# Patient Record
Sex: Male | Born: 1980 | Race: White | Hispanic: No | Marital: Married | Smoking: Never smoker
Health system: Southern US, Community
[De-identification: ages and names within clinical notes are randomized; demographics above are authoritative.]

## PROBLEM LIST (undated history)

## (undated) DIAGNOSIS — J939 Pneumothorax, unspecified: Secondary | ICD-10-CM

## (undated) DIAGNOSIS — F419 Anxiety disorder, unspecified: Secondary | ICD-10-CM

## (undated) DIAGNOSIS — F32A Depression, unspecified: Secondary | ICD-10-CM

## (undated) DIAGNOSIS — I10 Essential (primary) hypertension: Secondary | ICD-10-CM

## (undated) DIAGNOSIS — F1021 Alcohol dependence, in remission: Secondary | ICD-10-CM

## (undated) DIAGNOSIS — J069 Acute upper respiratory infection, unspecified: Secondary | ICD-10-CM

## (undated) DIAGNOSIS — H5213 Myopia, bilateral: Secondary | ICD-10-CM

## (undated) DIAGNOSIS — K219 Gastro-esophageal reflux disease without esophagitis: Secondary | ICD-10-CM

## (undated) HISTORY — DX: Acute upper respiratory infection, unspecified: J06.9

## (undated) HISTORY — PX: WISDOM TOOTH EXTRACTION: SHX21

## (undated) HISTORY — PX: ESOPHAGOGASTRODUODENOSCOPY ENDOSCOPY: SHX5814

## (undated) HISTORY — DX: Anxiety disorder, unspecified: F41.9

## (undated) HISTORY — PX: TONSILLECTOMY: SUR1361

## (undated) HISTORY — DX: Depression, unspecified: F32.A

---

## 2010-01-30 DIAGNOSIS — J939 Pneumothorax, unspecified: Secondary | ICD-10-CM

## 2010-01-30 HISTORY — DX: Pneumothorax, unspecified: J93.9

## 2010-02-04 ENCOUNTER — Inpatient Hospital Stay (HOSPITAL_COMMUNITY): Admission: AC | Admit: 2010-02-04 | Discharge: 2010-02-07 | Payer: Self-pay | Source: Home / Self Care

## 2010-02-14 LAB — COMPREHENSIVE METABOLIC PANEL
ALT: 163 U/L — ABNORMAL HIGH (ref 0–53)
AST: 108 U/L — ABNORMAL HIGH (ref 0–37)
Albumin: 4.1 g/dL (ref 3.5–5.2)
Alkaline Phosphatase: 43 U/L (ref 39–117)
BUN: 18 mg/dL (ref 6–23)
CO2: 20 meq/L (ref 19–32)
Calcium: 8.3 mg/dL — ABNORMAL LOW (ref 8.4–10.5)
Chloride: 108 meq/L (ref 96–112)
Creatinine, Ser: 1.03 mg/dL (ref 0.4–1.5)
GFR calc Af Amer: >60 mL/min (ref >60)
GFR calc non Af Amer: >60 mL/min (ref >60)
Glucose, Bld: 158 mg/dL — ABNORMAL HIGH (ref 70–99)
Potassium: 3.4 meq/L — ABNORMAL LOW (ref 3.5–5.1)
Sodium: 140 meq/L (ref 135–145)
Total Bilirubin: 0.9 mg/dL (ref 0.3–1.2)
Total Protein: 6.9 g/dL (ref 6.0–8.3)

## 2010-02-14 LAB — POCT I-STAT, CHEM 8
BUN: 19 mg/dL (ref 6–23)
Calcium, Ion: 0.92 mmol/L — ABNORMAL LOW (ref 1.12–1.32)
Chloride: 109 meq/L (ref 96–112)
Creatinine, Ser: 1.2 mg/dL (ref 0.4–1.5)
Glucose, Bld: 153 mg/dL — ABNORMAL HIGH (ref 70–99)
HCT: 44 % (ref 39.0–52.0)
Hemoglobin: 15 g/dL (ref 13.0–17.0)
Potassium: 3.3 meq/L — ABNORMAL LOW (ref 3.5–5.1)
Sodium: 139 meq/L (ref 135–145)
TCO2: 17 mmol/L (ref 0–100)

## 2010-02-14 LAB — CBC
HCT: 39.9 % (ref 39.0–52.0)
HCT: 37.3 % — ABNORMAL LOW (ref 39.0–52.0)
Hemoglobin: 13.3 g/dL (ref 13.0–17.0)
Hemoglobin: 12.3 g/dL — ABNORMAL LOW (ref 13.0–17.0)
MCH: 31 pg (ref 26.0–34.0)
MCH: 30.7 pg (ref 26.0–34.0)
MCHC: 33.3 g/dL (ref 30.0–36.0)
MCHC: 33 g/dL (ref 30.0–36.0)
MCV: 93 fL (ref 78.0–100.0)
MCV: 93 fL (ref 78.0–100.0)
Platelets: 236 10*3/uL (ref 150–400)
Platelets: 205 10*3/uL (ref 150–400)
RBC: 4.29 MIL/uL (ref 4.22–5.81)
RBC: 4.01 MIL/uL — ABNORMAL LOW (ref 4.22–5.81)
RDW: 14 % (ref 11.5–15.5)
RDW: 14.1 % (ref 11.5–15.5)
WBC: 11.5 10*3/uL — ABNORMAL HIGH (ref 4.0–10.5)
WBC: 6.4 10*3/uL (ref 4.0–10.5)

## 2010-02-14 LAB — BASIC METABOLIC PANEL
BUN: 9 mg/dL (ref 6–23)
CO2: 25 mEq/L (ref 19–32)
Calcium: 8.3 mg/dL — ABNORMAL LOW (ref 8.4–10.5)
Chloride: 104 mEq/L (ref 96–112)
Creatinine, Ser: 1 mg/dL (ref 0.4–1.5)
GFR calc Af Amer: >60 mL/min (ref >60)
GFR calc non Af Amer: >60 mL/min (ref >60)
Glucose, Bld: 162 mg/dL — ABNORMAL HIGH (ref 70–99)
Potassium: 4.4 mEq/L (ref 3.5–5.1)
Sodium: 138 mEq/L (ref 135–145)

## 2010-02-14 LAB — PROTIME-INR
INR: 0.94 (ref 0.00–1.49)
Prothrombin Time: 12.8 s (ref 11.6–15.2)

## 2010-02-14 LAB — URINALYSIS, ROUTINE W REFLEX MICROSCOPIC
Bilirubin Urine: NEGATIVE
Hgb urine dipstick: NEGATIVE
Ketones, ur: 15 mg/dL — AB
Nitrite: NEGATIVE
Protein, ur: NEGATIVE mg/dL
Specific Gravity, Urine: 1.031 — ABNORMAL HIGH (ref 1.005–1.030)
Urine Glucose, Fasting: NEGATIVE mg/dL
Urobilinogen, UA: 0.2 mg/dL (ref 0.0–1.0)
pH: 5 (ref 5.0–8.0)

## 2010-02-14 LAB — MRSA PCR SCREENING: MRSA by PCR: NEGATIVE

## 2010-02-14 LAB — LACTIC ACID, PLASMA: Lactic Acid, Venous: 4.8 mmol/L — ABNORMAL HIGH (ref 0.5–2.2)

## 2010-02-14 LAB — TYPE AND SCREEN
ABO/RH(D): O POS
Antibody Screen: NEGATIVE

## 2010-02-14 LAB — ABO/RH: ABO/RH(D): O POS
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
## 2015-07-21 ENCOUNTER — Emergency Department (HOSPITAL_COMMUNITY)
Admission: EM | Admit: 2015-07-21 | Discharge: 2015-07-21 | Disposition: A | Discharge status: Home / Self Care | Payer: BLUE CROSS/BLUE SHIELD | Attending: Emergency Medicine | Admitting: Emergency Medicine

## 2015-07-21 ENCOUNTER — Encounter (HOSPITAL_COMMUNITY): Payer: Self-pay | Admitting: *Deleted

## 2015-07-21 DIAGNOSIS — R112 Nausea with vomiting, unspecified: Secondary | ICD-10-CM | POA: Insufficient documentation

## 2015-07-21 DIAGNOSIS — R197 Diarrhea, unspecified: Secondary | ICD-10-CM

## 2015-07-21 HISTORY — DX: Essential (primary) hypertension: I10

## 2015-07-21 HISTORY — DX: Pneumothorax, unspecified: J93.9

## 2015-07-21 LAB — CBC WITH DIFFERENTIAL/PLATELET
Basophils Absolute: 0 10*3/uL (ref 0.0–0.1)
Basophils Relative: 1 %
Eosinophils Absolute: 0.1 10*3/uL (ref 0.0–0.7)
Eosinophils Relative: 1 %
HCT: 46.7 % (ref 39.0–52.0)
Hemoglobin: 16.1 g/dL (ref 13.0–17.0)
Lymphocytes Relative: 21 %
Lymphs Abs: 1.2 10*3/uL (ref 0.7–4.0)
MCH: 29.2 pg (ref 26.0–34.0)
MCHC: 34.5 g/dL (ref 30.0–36.0)
MCV: 84.8 fL (ref 78.0–100.0)
Monocytes Absolute: 0.6 10*3/uL (ref 0.1–1.0)
Monocytes Relative: 10 %
Neutro Abs: 4 10*3/uL (ref 1.7–7.7)
Neutrophils Relative %: 67 %
Platelets: 325 10*3/uL (ref 150–400)
RBC: 5.51 MIL/uL (ref 4.22–5.81)
RDW: 13.4 % (ref 11.5–15.5)
WBC: 5.9 10*3/uL (ref 4.0–10.5)

## 2015-07-21 LAB — COMPREHENSIVE METABOLIC PANEL
ALT: 232 U/L — ABNORMAL HIGH (ref 17–63)
AST: 174 U/L — ABNORMAL HIGH (ref 15–41)
Albumin: 4.5 g/dL (ref 3.5–5.0)
Alkaline Phosphatase: 46 U/L (ref 38–126)
Anion gap: 17 — ABNORMAL HIGH (ref 5–15)
BUN: 18 mg/dL (ref 6–20)
CO2: 28 mmol/L (ref 22–32)
Calcium: 9.5 mg/dL (ref 8.9–10.3)
Chloride: 90 mmol/L — ABNORMAL LOW (ref 101–111)
Creatinine, Ser: 1.04 mg/dL (ref 0.61–1.24)
GFR calc Af Amer: >60 mL/min (ref >60)
GFR calc non Af Amer: >60 mL/min (ref >60)
Glucose, Bld: 161 mg/dL — ABNORMAL HIGH (ref 65–99)
Potassium: 3.6 mmol/L (ref 3.5–5.1)
Sodium: 135 mmol/L (ref 135–145)
Total Bilirubin: 2.2 mg/dL — ABNORMAL HIGH (ref 0.3–1.2)
Total Protein: 7.8 g/dL (ref 6.5–8.1)

## 2015-07-21 LAB — URINALYSIS, ROUTINE W REFLEX MICROSCOPIC
Glucose, UA: NEGATIVE mg/dL
Hgb urine dipstick: NEGATIVE
Ketones, ur: 15 mg/dL — AB
LEUKOCYTES UA: NEGATIVE
NITRITE: NEGATIVE
PROTEIN: 30 mg/dL — AB
Specific Gravity, Urine: 1.033 — ABNORMAL HIGH (ref 1.005–1.030)
pH: 5.5 (ref 5.0–8.0)

## 2015-07-21 LAB — URINE MICROSCOPIC-ADD ON

## 2015-07-21 MED ORDER — DIPHENOXYLATE-ATROPINE 2.5-0.025 MG PO TABS: 2.0000 | ORAL_TABLET | Freq: Four times a day (QID) | ORAL | Status: DC | PRN

## 2015-07-21 MED ORDER — SODIUM CHLORIDE 0.9 % IV BOLUS (SEPSIS)
1000.0000 mL | Freq: Once | INTRAVENOUS | Status: AC
  Administered 2015-07-21: 1000 mL via INTRAVENOUS

## 2015-07-21 MED ORDER — ONDANSETRON HCL 4 MG/2ML IJ SOLN
4.0000 mg | Freq: Once | INTRAMUSCULAR | Status: AC
  Administered 2015-07-21: 4 mg via INTRAVENOUS
  Filled 2015-07-21: qty 2

## 2015-07-21 MED ORDER — DIPHENOXYLATE-ATROPINE 2.5-0.025 MG PO TABS
2.0000 | ORAL_TABLET | Freq: Once | ORAL | Status: AC
  Administered 2015-07-21: 2 via ORAL
  Filled 2015-07-21: qty 2

## 2015-07-21 MED ORDER — PROMETHAZINE HCL 25 MG PO TABS: 25.0000 mg | ORAL_TABLET | Freq: Four times a day (QID) | ORAL | Status: DC | PRN

## 2015-07-21 NOTE — ED Provider Notes (Signed)
CSN: [id number]     Arrival date & time 07/21/15  0456 History   First MD Initiated Contact with Patient 07/21/15 [phone]     Chief Complaint  Patient presents with  . Nausea  . Emesis     (Consider location/radiation/quality/duration/timing/severity/associated sxs/prior Treatment) HPI Comments: Patient presents for evaluation of 2 day history of nausea, vomiting and diarrhea. Patient is concerned about the possibility of food poisoning because symptoms began after eating pork. He has not been expressing any abdominal pain. He has not noticed any fever.  Patient is a 35 y.o. male presenting with vomiting.  Emesis Associated symptoms: diarrhea     History reviewed. No pertinent past medical history. History reviewed. No pertinent past surgical history. History reviewed. No pertinent family history. Social History  Substance Use Topics  . Smoking status: Never Smoker   . Smokeless tobacco: None  . Alcohol Use: No    Review of Systems  Gastrointestinal: Positive for nausea, vomiting and diarrhea.  All other systems reviewed and are negative.     Allergies  Review of patient's allergies indicates no known allergies.  Home Medications   Prior to Admission medications   Not on File   BP 155/112 mmHg  Pulse 112  Temp(Src) 97.7 F (36.5 C) (Oral)  Resp 17  SpO2 97% Physical Exam  Constitutional: He is oriented to person, place, and time. He appears well-developed and well-nourished. No distress.  HENT:  Head: Normocephalic and atraumatic.  Right Ear: Hearing normal.  Left Ear: Hearing normal.  Nose: Nose normal.  Mouth/Throat: Oropharynx is clear and moist and mucous membranes are normal.  Eyes: Conjunctivae and EOM are normal. Pupils are equal, round, and reactive to light.  Neck: Normal range of motion. Neck supple.  Cardiovascular: Regular rhythm, S1 normal and S2 normal.  Tachycardia present.  Exam reveals no gallop and no friction rub.   No murmur  heard. Pulmonary/Chest: Effort normal and breath sounds normal. No respiratory distress. He exhibits no tenderness.  Abdominal: Soft. Normal appearance and bowel sounds are normal. There is no hepatosplenomegaly. There is no tenderness. There is no rebound, no guarding, no tenderness at McBurney's point and negative Murphy's sign. No hernia.  Musculoskeletal: Normal range of motion.  Neurological: He is alert and oriented to person, place, and time. He has normal strength. No cranial nerve deficit or sensory deficit. Coordination normal. GCS eye subscore is 4. GCS verbal subscore is 5. GCS motor subscore is 6.  Skin: Skin is warm, dry and intact. No rash noted. No cyanosis.  Psychiatric: He has a normal mood and affect. His speech is normal and behavior is normal. Thought content normal.  Nursing note and vitals reviewed.   ED Course  Procedures (including critical care time) Labs Review Labs Reviewed  CBC WITH DIFFERENTIAL/PLATELET  COMPREHENSIVE METABOLIC PANEL    Imaging Review No results found. I have personally reviewed and evaluated these images and lab results as part of my medical decision-making.   EKG Interpretation None      MDM   Final diagnoses:  None  Nausea and vomiting Diarrhea  Patient presents to the ER for evaluation of nausea, vomiting and diarrhea. Patient concerned about the possibility of food poisoning. He is not expressing abdominal pain. Patient has a completely benign abdominal exam. No evidence of tenderness. No concern for acute surgical process. LFTs were mildly elevated. No right upper quadrant pain or tenderness. Reviewing records reveals previous elevation of his LFTs of unclear etiology. Patient hydrated aggressively here  in the ER and feeling better. Will be discharged with symptomatic treatment and follow-up with GI for repeat testing of his LFTs.    Gilda Creasehristopher J Earlie Schank, MD 07/21/15 [phone]

## 2015-07-21 NOTE — ED Notes (Signed)
Pt verbalized understanding of d/c instructions, prescriptions, and follow-up care. No further questions/concerns, VSS, ambulatory w/ steady gait (refused wheelchair) 

## 2015-07-21 NOTE — ED Notes (Signed)
Pt c/o NVD x 2 days, unable to tolerate fluids at home. Denies abd pain, reports NV started after eating pork

## 2015-07-21 NOTE — Discharge Instructions (Signed)

## 2015-08-13 DIAGNOSIS — F419 Anxiety disorder, unspecified: Secondary | ICD-10-CM | POA: Diagnosis not present

## 2015-08-13 DIAGNOSIS — K219 Gastro-esophageal reflux disease without esophagitis: Secondary | ICD-10-CM | POA: Diagnosis not present

## 2015-08-13 DIAGNOSIS — Z0000 Encounter for general adult medical examination without abnormal findings: Secondary | ICD-10-CM | POA: Diagnosis not present

## 2015-08-16 ENCOUNTER — Emergency Department (HOSPITAL_COMMUNITY)
Admission: EM | Admit: 2015-08-16 | Discharge: 2015-08-16 | Disposition: A | Discharge status: Home / Self Care | Payer: BLUE CROSS/BLUE SHIELD | Attending: Emergency Medicine | Admitting: Emergency Medicine

## 2015-08-16 ENCOUNTER — Encounter (HOSPITAL_COMMUNITY): Payer: Self-pay

## 2015-08-16 DIAGNOSIS — Y9389 Activity, other specified: Secondary | ICD-10-CM | POA: Diagnosis not present

## 2015-08-16 DIAGNOSIS — Y92410 Unspecified street and highway as the place of occurrence of the external cause: Secondary | ICD-10-CM | POA: Diagnosis not present

## 2015-08-16 DIAGNOSIS — F10129 Alcohol abuse with intoxication, unspecified: Secondary | ICD-10-CM | POA: Insufficient documentation

## 2015-08-16 DIAGNOSIS — Y999 Unspecified external cause status: Secondary | ICD-10-CM | POA: Diagnosis not present

## 2015-08-16 DIAGNOSIS — I10 Essential (primary) hypertension: Secondary | ICD-10-CM | POA: Insufficient documentation

## 2015-08-16 DIAGNOSIS — F1010 Alcohol abuse, uncomplicated: Secondary | ICD-10-CM

## 2015-08-16 MED ORDER — SODIUM CHLORIDE 0.9 % IV BOLUS (SEPSIS)
1000.0000 mL | Freq: Once | INTRAVENOUS | Status: AC
  Administered 2015-08-16: 1000 mL via INTRAVENOUS
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
## 2015-08-16 NOTE — ED Notes (Signed)
[employer] PRESENT
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
## 2015-08-16 NOTE — ED Notes (Signed)
ED PA at bedside

## 2015-08-16 NOTE — ED Notes (Signed)
Bed: [facility] Expected date:  Expected time:  Means of arrival:  Comments: 

## 2016-03-15 DIAGNOSIS — K219 Gastro-esophageal reflux disease without esophagitis: Secondary | ICD-10-CM | POA: Diagnosis not present

## 2016-03-15 DIAGNOSIS — F419 Anxiety disorder, unspecified: Secondary | ICD-10-CM | POA: Diagnosis not present

## 2016-08-04 DIAGNOSIS — H109 Unspecified conjunctivitis: Secondary | ICD-10-CM | POA: Diagnosis not present

## 2016-10-30 DIAGNOSIS — J309 Allergic rhinitis, unspecified: Secondary | ICD-10-CM | POA: Diagnosis not present

## 2016-10-30 DIAGNOSIS — K219 Gastro-esophageal reflux disease without esophagitis: Secondary | ICD-10-CM | POA: Diagnosis not present

## 2016-10-30 DIAGNOSIS — F418 Other specified anxiety disorders: Secondary | ICD-10-CM | POA: Diagnosis not present

## 2016-11-23 DIAGNOSIS — K219 Gastro-esophageal reflux disease without esophagitis: Secondary | ICD-10-CM | POA: Diagnosis not present

## 2016-11-23 DIAGNOSIS — R12 Heartburn: Secondary | ICD-10-CM | POA: Diagnosis not present

## 2017-01-15 DIAGNOSIS — K3189 Other diseases of stomach and duodenum: Secondary | ICD-10-CM | POA: Diagnosis not present

## 2017-01-15 DIAGNOSIS — K210 Gastro-esophageal reflux disease with esophagitis: Secondary | ICD-10-CM | POA: Diagnosis not present

## 2017-01-15 DIAGNOSIS — K449 Diaphragmatic hernia without obstruction or gangrene: Secondary | ICD-10-CM | POA: Diagnosis not present

## 2017-01-15 DIAGNOSIS — K219 Gastro-esophageal reflux disease without esophagitis: Secondary | ICD-10-CM | POA: Diagnosis not present

## 2017-01-15 DIAGNOSIS — K229 Disease of esophagus, unspecified: Secondary | ICD-10-CM | POA: Diagnosis not present

## 2017-01-15 DIAGNOSIS — R12 Heartburn: Secondary | ICD-10-CM | POA: Diagnosis not present

## 2017-01-22 DIAGNOSIS — K210 Gastro-esophageal reflux disease with esophagitis: Secondary | ICD-10-CM | POA: Diagnosis not present

## 2017-04-30 DIAGNOSIS — K219 Gastro-esophageal reflux disease without esophagitis: Secondary | ICD-10-CM | POA: Diagnosis not present

## 2017-04-30 DIAGNOSIS — F418 Other specified anxiety disorders: Secondary | ICD-10-CM | POA: Diagnosis not present

## 2017-04-30 DIAGNOSIS — J309 Allergic rhinitis, unspecified: Secondary | ICD-10-CM | POA: Diagnosis not present

## 2017-10-30 DIAGNOSIS — R12 Heartburn: Secondary | ICD-10-CM | POA: Diagnosis not present

## 2017-10-30 DIAGNOSIS — F419 Anxiety disorder, unspecified: Secondary | ICD-10-CM | POA: Diagnosis not present

## 2017-10-30 DIAGNOSIS — E781 Pure hyperglyceridemia: Secondary | ICD-10-CM | POA: Diagnosis not present

## 2018-05-01 DIAGNOSIS — Z0000 Encounter for general adult medical examination without abnormal findings: Secondary | ICD-10-CM | POA: Diagnosis not present

## 2018-09-24 DIAGNOSIS — L255 Unspecified contact dermatitis due to plants, except food: Secondary | ICD-10-CM | POA: Diagnosis not present

## 2018-09-24 DIAGNOSIS — R070 Pain in throat: Secondary | ICD-10-CM | POA: Diagnosis not present

## 2018-10-31 DIAGNOSIS — J309 Allergic rhinitis, unspecified: Secondary | ICD-10-CM | POA: Diagnosis not present

## 2018-10-31 DIAGNOSIS — F418 Other specified anxiety disorders: Secondary | ICD-10-CM | POA: Diagnosis not present

## 2019-05-07 DIAGNOSIS — Z0000 Encounter for general adult medical examination without abnormal findings: Secondary | ICD-10-CM | POA: Diagnosis not present

## 2019-05-07 DIAGNOSIS — E039 Hypothyroidism, unspecified: Secondary | ICD-10-CM | POA: Diagnosis not present

## 2019-05-07 DIAGNOSIS — Z13220 Encounter for screening for lipoid disorders: Secondary | ICD-10-CM | POA: Diagnosis not present

## 2019-06-18 DIAGNOSIS — E039 Hypothyroidism, unspecified: Secondary | ICD-10-CM | POA: Diagnosis not present

## 2019-11-06 DIAGNOSIS — F419 Anxiety disorder, unspecified: Secondary | ICD-10-CM | POA: Diagnosis not present

## 2019-11-06 DIAGNOSIS — J339 Nasal polyp, unspecified: Secondary | ICD-10-CM | POA: Diagnosis not present

## 2019-11-06 DIAGNOSIS — K219 Gastro-esophageal reflux disease without esophagitis: Secondary | ICD-10-CM | POA: Diagnosis not present

## 2019-11-06 DIAGNOSIS — R030 Elevated blood-pressure reading, without diagnosis of hypertension: Secondary | ICD-10-CM | POA: Diagnosis not present

## 2020-01-10 DIAGNOSIS — R059 Cough, unspecified: Secondary | ICD-10-CM | POA: Diagnosis not present

## 2020-02-09 DIAGNOSIS — F1020 Alcohol dependence, uncomplicated: Secondary | ICD-10-CM | POA: Diagnosis not present

## 2020-02-09 DIAGNOSIS — G4700 Insomnia, unspecified: Secondary | ICD-10-CM | POA: Diagnosis not present

## 2020-02-09 DIAGNOSIS — F419 Anxiety disorder, unspecified: Secondary | ICD-10-CM | POA: Diagnosis not present

## 2020-02-09 DIAGNOSIS — F1310 Sedative, hypnotic or anxiolytic abuse, uncomplicated: Secondary | ICD-10-CM | POA: Diagnosis not present

## 2020-02-09 DIAGNOSIS — F329 Major depressive disorder, single episode, unspecified: Secondary | ICD-10-CM | POA: Diagnosis not present

## 2020-02-12 DIAGNOSIS — F329 Major depressive disorder, single episode, unspecified: Secondary | ICD-10-CM | POA: Diagnosis not present

## 2020-02-12 DIAGNOSIS — G4700 Insomnia, unspecified: Secondary | ICD-10-CM | POA: Diagnosis not present

## 2020-02-12 DIAGNOSIS — F1310 Sedative, hypnotic or anxiolytic abuse, uncomplicated: Secondary | ICD-10-CM | POA: Diagnosis not present

## 2020-02-12 DIAGNOSIS — F1020 Alcohol dependence, uncomplicated: Secondary | ICD-10-CM | POA: Diagnosis not present

## 2020-02-12 DIAGNOSIS — F419 Anxiety disorder, unspecified: Secondary | ICD-10-CM | POA: Diagnosis not present

## 2020-02-13 DIAGNOSIS — F329 Major depressive disorder, single episode, unspecified: Secondary | ICD-10-CM | POA: Diagnosis not present

## 2020-02-13 DIAGNOSIS — F419 Anxiety disorder, unspecified: Secondary | ICD-10-CM | POA: Diagnosis not present

## 2020-02-13 DIAGNOSIS — F1310 Sedative, hypnotic or anxiolytic abuse, uncomplicated: Secondary | ICD-10-CM | POA: Diagnosis not present

## 2020-02-13 DIAGNOSIS — G4700 Insomnia, unspecified: Secondary | ICD-10-CM | POA: Diagnosis not present

## 2020-02-13 DIAGNOSIS — F1020 Alcohol dependence, uncomplicated: Secondary | ICD-10-CM | POA: Diagnosis not present

## 2020-02-14 DIAGNOSIS — G4700 Insomnia, unspecified: Secondary | ICD-10-CM | POA: Diagnosis not present

## 2020-02-14 DIAGNOSIS — F1020 Alcohol dependence, uncomplicated: Secondary | ICD-10-CM | POA: Diagnosis not present

## 2020-02-14 DIAGNOSIS — F1310 Sedative, hypnotic or anxiolytic abuse, uncomplicated: Secondary | ICD-10-CM | POA: Diagnosis not present

## 2020-02-14 DIAGNOSIS — F419 Anxiety disorder, unspecified: Secondary | ICD-10-CM | POA: Diagnosis not present

## 2020-02-14 DIAGNOSIS — F329 Major depressive disorder, single episode, unspecified: Secondary | ICD-10-CM | POA: Diagnosis not present

## 2020-02-15 DIAGNOSIS — F329 Major depressive disorder, single episode, unspecified: Secondary | ICD-10-CM | POA: Diagnosis not present

## 2020-02-15 DIAGNOSIS — F1020 Alcohol dependence, uncomplicated: Secondary | ICD-10-CM | POA: Diagnosis not present

## 2020-02-15 DIAGNOSIS — G4700 Insomnia, unspecified: Secondary | ICD-10-CM | POA: Diagnosis not present

## 2020-02-15 DIAGNOSIS — F1310 Sedative, hypnotic or anxiolytic abuse, uncomplicated: Secondary | ICD-10-CM | POA: Diagnosis not present

## 2020-02-15 DIAGNOSIS — F419 Anxiety disorder, unspecified: Secondary | ICD-10-CM | POA: Diagnosis not present

## 2020-02-16 DIAGNOSIS — F419 Anxiety disorder, unspecified: Secondary | ICD-10-CM | POA: Diagnosis not present

## 2020-02-16 DIAGNOSIS — F329 Major depressive disorder, single episode, unspecified: Secondary | ICD-10-CM | POA: Diagnosis not present

## 2020-02-16 DIAGNOSIS — F1310 Sedative, hypnotic or anxiolytic abuse, uncomplicated: Secondary | ICD-10-CM | POA: Diagnosis not present

## 2020-02-16 DIAGNOSIS — G4700 Insomnia, unspecified: Secondary | ICD-10-CM | POA: Diagnosis not present

## 2020-02-16 DIAGNOSIS — F1020 Alcohol dependence, uncomplicated: Secondary | ICD-10-CM | POA: Diagnosis not present

## 2020-02-17 DIAGNOSIS — F1310 Sedative, hypnotic or anxiolytic abuse, uncomplicated: Secondary | ICD-10-CM | POA: Diagnosis not present

## 2020-02-17 DIAGNOSIS — F1020 Alcohol dependence, uncomplicated: Secondary | ICD-10-CM | POA: Diagnosis not present

## 2020-02-17 DIAGNOSIS — F419 Anxiety disorder, unspecified: Secondary | ICD-10-CM | POA: Diagnosis not present

## 2020-02-17 DIAGNOSIS — G4700 Insomnia, unspecified: Secondary | ICD-10-CM | POA: Diagnosis not present

## 2020-02-17 DIAGNOSIS — F329 Major depressive disorder, single episode, unspecified: Secondary | ICD-10-CM | POA: Diagnosis not present

## 2020-02-18 DIAGNOSIS — F1310 Sedative, hypnotic or anxiolytic abuse, uncomplicated: Secondary | ICD-10-CM | POA: Diagnosis not present

## 2020-02-18 DIAGNOSIS — F329 Major depressive disorder, single episode, unspecified: Secondary | ICD-10-CM | POA: Diagnosis not present

## 2020-02-18 DIAGNOSIS — F1020 Alcohol dependence, uncomplicated: Secondary | ICD-10-CM | POA: Diagnosis not present

## 2020-02-18 DIAGNOSIS — G4700 Insomnia, unspecified: Secondary | ICD-10-CM | POA: Diagnosis not present

## 2020-02-18 DIAGNOSIS — F419 Anxiety disorder, unspecified: Secondary | ICD-10-CM | POA: Diagnosis not present

## 2020-02-19 DIAGNOSIS — F419 Anxiety disorder, unspecified: Secondary | ICD-10-CM | POA: Diagnosis not present

## 2020-02-19 DIAGNOSIS — F1020 Alcohol dependence, uncomplicated: Secondary | ICD-10-CM | POA: Diagnosis not present

## 2020-02-19 DIAGNOSIS — F329 Major depressive disorder, single episode, unspecified: Secondary | ICD-10-CM | POA: Diagnosis not present

## 2020-02-19 DIAGNOSIS — F1310 Sedative, hypnotic or anxiolytic abuse, uncomplicated: Secondary | ICD-10-CM | POA: Diagnosis not present

## 2020-02-19 DIAGNOSIS — G4700 Insomnia, unspecified: Secondary | ICD-10-CM | POA: Diagnosis not present

## 2020-02-20 DIAGNOSIS — F1310 Sedative, hypnotic or anxiolytic abuse, uncomplicated: Secondary | ICD-10-CM | POA: Diagnosis not present

## 2020-02-20 DIAGNOSIS — G4700 Insomnia, unspecified: Secondary | ICD-10-CM | POA: Diagnosis not present

## 2020-02-20 DIAGNOSIS — F419 Anxiety disorder, unspecified: Secondary | ICD-10-CM | POA: Diagnosis not present

## 2020-02-20 DIAGNOSIS — F1020 Alcohol dependence, uncomplicated: Secondary | ICD-10-CM | POA: Diagnosis not present

## 2020-02-20 DIAGNOSIS — F329 Major depressive disorder, single episode, unspecified: Secondary | ICD-10-CM | POA: Diagnosis not present

## 2020-02-21 DIAGNOSIS — F1310 Sedative, hypnotic or anxiolytic abuse, uncomplicated: Secondary | ICD-10-CM | POA: Diagnosis not present

## 2020-02-21 DIAGNOSIS — F1020 Alcohol dependence, uncomplicated: Secondary | ICD-10-CM | POA: Diagnosis not present

## 2020-02-21 DIAGNOSIS — F419 Anxiety disorder, unspecified: Secondary | ICD-10-CM | POA: Diagnosis not present

## 2020-02-21 DIAGNOSIS — G4700 Insomnia, unspecified: Secondary | ICD-10-CM | POA: Diagnosis not present

## 2020-02-21 DIAGNOSIS — F329 Major depressive disorder, single episode, unspecified: Secondary | ICD-10-CM | POA: Diagnosis not present

## 2020-02-22 DIAGNOSIS — G4700 Insomnia, unspecified: Secondary | ICD-10-CM | POA: Diagnosis not present

## 2020-02-22 DIAGNOSIS — F329 Major depressive disorder, single episode, unspecified: Secondary | ICD-10-CM | POA: Diagnosis not present

## 2020-02-22 DIAGNOSIS — F1020 Alcohol dependence, uncomplicated: Secondary | ICD-10-CM | POA: Diagnosis not present

## 2020-02-22 DIAGNOSIS — F419 Anxiety disorder, unspecified: Secondary | ICD-10-CM | POA: Diagnosis not present

## 2020-02-22 DIAGNOSIS — F1310 Sedative, hypnotic or anxiolytic abuse, uncomplicated: Secondary | ICD-10-CM | POA: Diagnosis not present

## 2020-02-23 DIAGNOSIS — F419 Anxiety disorder, unspecified: Secondary | ICD-10-CM | POA: Diagnosis not present

## 2020-02-23 DIAGNOSIS — F1310 Sedative, hypnotic or anxiolytic abuse, uncomplicated: Secondary | ICD-10-CM | POA: Diagnosis not present

## 2020-02-23 DIAGNOSIS — G4700 Insomnia, unspecified: Secondary | ICD-10-CM | POA: Diagnosis not present

## 2020-02-23 DIAGNOSIS — F1020 Alcohol dependence, uncomplicated: Secondary | ICD-10-CM | POA: Diagnosis not present

## 2020-02-23 DIAGNOSIS — F329 Major depressive disorder, single episode, unspecified: Secondary | ICD-10-CM | POA: Diagnosis not present

## 2020-02-24 DIAGNOSIS — G4700 Insomnia, unspecified: Secondary | ICD-10-CM | POA: Diagnosis not present

## 2020-02-24 DIAGNOSIS — F1310 Sedative, hypnotic or anxiolytic abuse, uncomplicated: Secondary | ICD-10-CM | POA: Diagnosis not present

## 2020-02-24 DIAGNOSIS — F419 Anxiety disorder, unspecified: Secondary | ICD-10-CM | POA: Diagnosis not present

## 2020-02-24 DIAGNOSIS — F329 Major depressive disorder, single episode, unspecified: Secondary | ICD-10-CM | POA: Diagnosis not present

## 2020-02-24 DIAGNOSIS — F1020 Alcohol dependence, uncomplicated: Secondary | ICD-10-CM | POA: Diagnosis not present

## 2020-02-25 DIAGNOSIS — F1020 Alcohol dependence, uncomplicated: Secondary | ICD-10-CM | POA: Diagnosis not present

## 2020-02-25 DIAGNOSIS — F419 Anxiety disorder, unspecified: Secondary | ICD-10-CM | POA: Diagnosis not present

## 2020-02-25 DIAGNOSIS — F1310 Sedative, hypnotic or anxiolytic abuse, uncomplicated: Secondary | ICD-10-CM | POA: Diagnosis not present

## 2020-02-25 DIAGNOSIS — F329 Major depressive disorder, single episode, unspecified: Secondary | ICD-10-CM | POA: Diagnosis not present

## 2020-02-25 DIAGNOSIS — G4700 Insomnia, unspecified: Secondary | ICD-10-CM | POA: Diagnosis not present

## 2020-02-26 DIAGNOSIS — F1310 Sedative, hypnotic or anxiolytic abuse, uncomplicated: Secondary | ICD-10-CM | POA: Diagnosis not present

## 2020-02-26 DIAGNOSIS — F1020 Alcohol dependence, uncomplicated: Secondary | ICD-10-CM | POA: Diagnosis not present

## 2020-02-26 DIAGNOSIS — F419 Anxiety disorder, unspecified: Secondary | ICD-10-CM | POA: Diagnosis not present

## 2020-02-26 DIAGNOSIS — G4700 Insomnia, unspecified: Secondary | ICD-10-CM | POA: Diagnosis not present

## 2020-02-26 DIAGNOSIS — F329 Major depressive disorder, single episode, unspecified: Secondary | ICD-10-CM | POA: Diagnosis not present

## 2020-02-27 DIAGNOSIS — F419 Anxiety disorder, unspecified: Secondary | ICD-10-CM | POA: Diagnosis not present

## 2020-02-27 DIAGNOSIS — F329 Major depressive disorder, single episode, unspecified: Secondary | ICD-10-CM | POA: Diagnosis not present

## 2020-02-27 DIAGNOSIS — F1310 Sedative, hypnotic or anxiolytic abuse, uncomplicated: Secondary | ICD-10-CM | POA: Diagnosis not present

## 2020-02-27 DIAGNOSIS — F1020 Alcohol dependence, uncomplicated: Secondary | ICD-10-CM | POA: Diagnosis not present

## 2020-02-27 DIAGNOSIS — G4700 Insomnia, unspecified: Secondary | ICD-10-CM | POA: Diagnosis not present

## 2020-02-28 DIAGNOSIS — G4700 Insomnia, unspecified: Secondary | ICD-10-CM | POA: Diagnosis not present

## 2020-02-28 DIAGNOSIS — F329 Major depressive disorder, single episode, unspecified: Secondary | ICD-10-CM | POA: Diagnosis not present

## 2020-02-28 DIAGNOSIS — F419 Anxiety disorder, unspecified: Secondary | ICD-10-CM | POA: Diagnosis not present

## 2020-02-28 DIAGNOSIS — F1020 Alcohol dependence, uncomplicated: Secondary | ICD-10-CM | POA: Diagnosis not present

## 2020-02-28 DIAGNOSIS — F1310 Sedative, hypnotic or anxiolytic abuse, uncomplicated: Secondary | ICD-10-CM | POA: Diagnosis not present

## 2020-02-29 DIAGNOSIS — G4700 Insomnia, unspecified: Secondary | ICD-10-CM | POA: Diagnosis not present

## 2020-02-29 DIAGNOSIS — F419 Anxiety disorder, unspecified: Secondary | ICD-10-CM | POA: Diagnosis not present

## 2020-02-29 DIAGNOSIS — F1310 Sedative, hypnotic or anxiolytic abuse, uncomplicated: Secondary | ICD-10-CM | POA: Diagnosis not present

## 2020-02-29 DIAGNOSIS — F1020 Alcohol dependence, uncomplicated: Secondary | ICD-10-CM | POA: Diagnosis not present

## 2020-02-29 DIAGNOSIS — F329 Major depressive disorder, single episode, unspecified: Secondary | ICD-10-CM | POA: Diagnosis not present

## 2020-03-01 DIAGNOSIS — F419 Anxiety disorder, unspecified: Secondary | ICD-10-CM | POA: Diagnosis not present

## 2020-03-01 DIAGNOSIS — G4700 Insomnia, unspecified: Secondary | ICD-10-CM | POA: Diagnosis not present

## 2020-03-01 DIAGNOSIS — F329 Major depressive disorder, single episode, unspecified: Secondary | ICD-10-CM | POA: Diagnosis not present

## 2020-03-01 DIAGNOSIS — F1310 Sedative, hypnotic or anxiolytic abuse, uncomplicated: Secondary | ICD-10-CM | POA: Diagnosis not present

## 2020-03-01 DIAGNOSIS — F1020 Alcohol dependence, uncomplicated: Secondary | ICD-10-CM | POA: Diagnosis not present

## 2020-03-02 DIAGNOSIS — G4700 Insomnia, unspecified: Secondary | ICD-10-CM | POA: Diagnosis not present

## 2020-03-02 DIAGNOSIS — F1310 Sedative, hypnotic or anxiolytic abuse, uncomplicated: Secondary | ICD-10-CM | POA: Diagnosis not present

## 2020-03-02 DIAGNOSIS — F1020 Alcohol dependence, uncomplicated: Secondary | ICD-10-CM | POA: Diagnosis not present

## 2020-03-02 DIAGNOSIS — F419 Anxiety disorder, unspecified: Secondary | ICD-10-CM | POA: Diagnosis not present

## 2020-03-02 DIAGNOSIS — F329 Major depressive disorder, single episode, unspecified: Secondary | ICD-10-CM | POA: Diagnosis not present

## 2020-03-03 DIAGNOSIS — F1020 Alcohol dependence, uncomplicated: Secondary | ICD-10-CM | POA: Diagnosis not present

## 2020-03-03 DIAGNOSIS — F1310 Sedative, hypnotic or anxiolytic abuse, uncomplicated: Secondary | ICD-10-CM | POA: Diagnosis not present

## 2020-03-03 DIAGNOSIS — F329 Major depressive disorder, single episode, unspecified: Secondary | ICD-10-CM | POA: Diagnosis not present

## 2020-03-03 DIAGNOSIS — G4700 Insomnia, unspecified: Secondary | ICD-10-CM | POA: Diagnosis not present

## 2020-03-03 DIAGNOSIS — F419 Anxiety disorder, unspecified: Secondary | ICD-10-CM | POA: Diagnosis not present

## 2020-03-04 DIAGNOSIS — G4700 Insomnia, unspecified: Secondary | ICD-10-CM | POA: Diagnosis not present

## 2020-03-04 DIAGNOSIS — F329 Major depressive disorder, single episode, unspecified: Secondary | ICD-10-CM | POA: Diagnosis not present

## 2020-03-04 DIAGNOSIS — F1020 Alcohol dependence, uncomplicated: Secondary | ICD-10-CM | POA: Diagnosis not present

## 2020-03-04 DIAGNOSIS — F1310 Sedative, hypnotic or anxiolytic abuse, uncomplicated: Secondary | ICD-10-CM | POA: Diagnosis not present

## 2020-03-04 DIAGNOSIS — F419 Anxiety disorder, unspecified: Secondary | ICD-10-CM | POA: Diagnosis not present

## 2020-03-05 DIAGNOSIS — F329 Major depressive disorder, single episode, unspecified: Secondary | ICD-10-CM | POA: Diagnosis not present

## 2020-03-05 DIAGNOSIS — F1020 Alcohol dependence, uncomplicated: Secondary | ICD-10-CM | POA: Diagnosis not present

## 2020-03-05 DIAGNOSIS — F1310 Sedative, hypnotic or anxiolytic abuse, uncomplicated: Secondary | ICD-10-CM | POA: Diagnosis not present

## 2020-03-05 DIAGNOSIS — F419 Anxiety disorder, unspecified: Secondary | ICD-10-CM | POA: Diagnosis not present

## 2020-03-05 DIAGNOSIS — G4700 Insomnia, unspecified: Secondary | ICD-10-CM | POA: Diagnosis not present

## 2020-03-06 DIAGNOSIS — F1310 Sedative, hypnotic or anxiolytic abuse, uncomplicated: Secondary | ICD-10-CM | POA: Diagnosis not present

## 2020-03-06 DIAGNOSIS — F419 Anxiety disorder, unspecified: Secondary | ICD-10-CM | POA: Diagnosis not present

## 2020-03-06 DIAGNOSIS — F329 Major depressive disorder, single episode, unspecified: Secondary | ICD-10-CM | POA: Diagnosis not present

## 2020-03-06 DIAGNOSIS — F1020 Alcohol dependence, uncomplicated: Secondary | ICD-10-CM | POA: Diagnosis not present

## 2020-03-06 DIAGNOSIS — G4700 Insomnia, unspecified: Secondary | ICD-10-CM | POA: Diagnosis not present

## 2020-03-07 DIAGNOSIS — G4700 Insomnia, unspecified: Secondary | ICD-10-CM | POA: Diagnosis not present

## 2020-03-07 DIAGNOSIS — F329 Major depressive disorder, single episode, unspecified: Secondary | ICD-10-CM | POA: Diagnosis not present

## 2020-03-07 DIAGNOSIS — F1020 Alcohol dependence, uncomplicated: Secondary | ICD-10-CM | POA: Diagnosis not present

## 2020-03-07 DIAGNOSIS — F1310 Sedative, hypnotic or anxiolytic abuse, uncomplicated: Secondary | ICD-10-CM | POA: Diagnosis not present

## 2020-03-07 DIAGNOSIS — F419 Anxiety disorder, unspecified: Secondary | ICD-10-CM | POA: Diagnosis not present

## 2020-03-08 DIAGNOSIS — F419 Anxiety disorder, unspecified: Secondary | ICD-10-CM | POA: Diagnosis not present

## 2020-03-08 DIAGNOSIS — F329 Major depressive disorder, single episode, unspecified: Secondary | ICD-10-CM | POA: Diagnosis not present

## 2020-03-08 DIAGNOSIS — G4700 Insomnia, unspecified: Secondary | ICD-10-CM | POA: Diagnosis not present

## 2020-03-08 DIAGNOSIS — R030 Elevated blood-pressure reading, without diagnosis of hypertension: Secondary | ICD-10-CM | POA: Diagnosis not present

## 2020-03-08 DIAGNOSIS — K219 Gastro-esophageal reflux disease without esophagitis: Secondary | ICD-10-CM | POA: Diagnosis not present

## 2020-03-08 DIAGNOSIS — F1020 Alcohol dependence, uncomplicated: Secondary | ICD-10-CM | POA: Diagnosis not present

## 2020-03-08 DIAGNOSIS — R45851 Suicidal ideations: Secondary | ICD-10-CM | POA: Diagnosis not present

## 2020-03-08 DIAGNOSIS — F1310 Sedative, hypnotic or anxiolytic abuse, uncomplicated: Secondary | ICD-10-CM | POA: Diagnosis not present

## 2020-03-08 DIAGNOSIS — R4586 Emotional lability: Secondary | ICD-10-CM | POA: Diagnosis not present

## 2020-03-08 DIAGNOSIS — Z8616 Personal history of COVID-19: Secondary | ICD-10-CM | POA: Diagnosis not present

## 2020-03-09 DIAGNOSIS — R45851 Suicidal ideations: Secondary | ICD-10-CM | POA: Diagnosis not present

## 2020-03-09 DIAGNOSIS — Z8616 Personal history of COVID-19: Secondary | ICD-10-CM | POA: Diagnosis not present

## 2020-03-09 DIAGNOSIS — F1310 Sedative, hypnotic or anxiolytic abuse, uncomplicated: Secondary | ICD-10-CM | POA: Diagnosis not present

## 2020-03-09 DIAGNOSIS — K219 Gastro-esophageal reflux disease without esophagitis: Secondary | ICD-10-CM | POA: Diagnosis not present

## 2020-03-09 DIAGNOSIS — R4586 Emotional lability: Secondary | ICD-10-CM | POA: Diagnosis not present

## 2020-03-09 DIAGNOSIS — F419 Anxiety disorder, unspecified: Secondary | ICD-10-CM | POA: Diagnosis not present

## 2020-03-09 DIAGNOSIS — G4700 Insomnia, unspecified: Secondary | ICD-10-CM | POA: Diagnosis not present

## 2020-03-09 DIAGNOSIS — F1020 Alcohol dependence, uncomplicated: Secondary | ICD-10-CM | POA: Diagnosis not present

## 2020-03-09 DIAGNOSIS — R030 Elevated blood-pressure reading, without diagnosis of hypertension: Secondary | ICD-10-CM | POA: Diagnosis not present

## 2020-03-09 DIAGNOSIS — F329 Major depressive disorder, single episode, unspecified: Secondary | ICD-10-CM | POA: Diagnosis not present

## 2020-03-11 DIAGNOSIS — R4586 Emotional lability: Secondary | ICD-10-CM | POA: Diagnosis not present

## 2020-03-11 DIAGNOSIS — K219 Gastro-esophageal reflux disease without esophagitis: Secondary | ICD-10-CM | POA: Diagnosis not present

## 2020-03-11 DIAGNOSIS — Z8616 Personal history of COVID-19: Secondary | ICD-10-CM | POA: Diagnosis not present

## 2020-03-11 DIAGNOSIS — R45851 Suicidal ideations: Secondary | ICD-10-CM | POA: Diagnosis not present

## 2020-03-11 DIAGNOSIS — R030 Elevated blood-pressure reading, without diagnosis of hypertension: Secondary | ICD-10-CM | POA: Diagnosis not present

## 2020-03-11 DIAGNOSIS — F1020 Alcohol dependence, uncomplicated: Secondary | ICD-10-CM | POA: Diagnosis not present

## 2020-03-11 DIAGNOSIS — F419 Anxiety disorder, unspecified: Secondary | ICD-10-CM | POA: Diagnosis not present

## 2020-03-11 DIAGNOSIS — F1310 Sedative, hypnotic or anxiolytic abuse, uncomplicated: Secondary | ICD-10-CM | POA: Diagnosis not present

## 2020-03-11 DIAGNOSIS — F329 Major depressive disorder, single episode, unspecified: Secondary | ICD-10-CM | POA: Diagnosis not present

## 2020-03-11 DIAGNOSIS — G4700 Insomnia, unspecified: Secondary | ICD-10-CM | POA: Diagnosis not present

## 2020-03-15 DIAGNOSIS — F419 Anxiety disorder, unspecified: Secondary | ICD-10-CM | POA: Diagnosis not present

## 2020-03-15 DIAGNOSIS — F1020 Alcohol dependence, uncomplicated: Secondary | ICD-10-CM | POA: Diagnosis not present

## 2020-03-15 DIAGNOSIS — G4700 Insomnia, unspecified: Secondary | ICD-10-CM | POA: Diagnosis not present

## 2020-03-15 DIAGNOSIS — Z8616 Personal history of COVID-19: Secondary | ICD-10-CM | POA: Diagnosis not present

## 2020-03-15 DIAGNOSIS — R4586 Emotional lability: Secondary | ICD-10-CM | POA: Diagnosis not present

## 2020-03-15 DIAGNOSIS — K219 Gastro-esophageal reflux disease without esophagitis: Secondary | ICD-10-CM | POA: Diagnosis not present

## 2020-03-15 DIAGNOSIS — F329 Major depressive disorder, single episode, unspecified: Secondary | ICD-10-CM | POA: Diagnosis not present

## 2020-03-15 DIAGNOSIS — F1310 Sedative, hypnotic or anxiolytic abuse, uncomplicated: Secondary | ICD-10-CM | POA: Diagnosis not present

## 2020-03-15 DIAGNOSIS — R030 Elevated blood-pressure reading, without diagnosis of hypertension: Secondary | ICD-10-CM | POA: Diagnosis not present

## 2020-03-15 DIAGNOSIS — R45851 Suicidal ideations: Secondary | ICD-10-CM | POA: Diagnosis not present

## 2020-03-16 DIAGNOSIS — F1310 Sedative, hypnotic or anxiolytic abuse, uncomplicated: Secondary | ICD-10-CM | POA: Diagnosis not present

## 2020-03-16 DIAGNOSIS — F1020 Alcohol dependence, uncomplicated: Secondary | ICD-10-CM | POA: Diagnosis not present

## 2020-03-16 DIAGNOSIS — F419 Anxiety disorder, unspecified: Secondary | ICD-10-CM | POA: Diagnosis not present

## 2020-03-16 DIAGNOSIS — R4586 Emotional lability: Secondary | ICD-10-CM | POA: Diagnosis not present

## 2020-03-16 DIAGNOSIS — Z8616 Personal history of COVID-19: Secondary | ICD-10-CM | POA: Diagnosis not present

## 2020-03-16 DIAGNOSIS — R030 Elevated blood-pressure reading, without diagnosis of hypertension: Secondary | ICD-10-CM | POA: Diagnosis not present

## 2020-03-16 DIAGNOSIS — F329 Major depressive disorder, single episode, unspecified: Secondary | ICD-10-CM | POA: Diagnosis not present

## 2020-03-16 DIAGNOSIS — G4700 Insomnia, unspecified: Secondary | ICD-10-CM | POA: Diagnosis not present

## 2020-03-16 DIAGNOSIS — K219 Gastro-esophageal reflux disease without esophagitis: Secondary | ICD-10-CM | POA: Diagnosis not present

## 2020-03-16 DIAGNOSIS — R45851 Suicidal ideations: Secondary | ICD-10-CM | POA: Diagnosis not present

## 2020-03-18 DIAGNOSIS — R4586 Emotional lability: Secondary | ICD-10-CM | POA: Diagnosis not present

## 2020-03-18 DIAGNOSIS — Z8616 Personal history of COVID-19: Secondary | ICD-10-CM | POA: Diagnosis not present

## 2020-03-18 DIAGNOSIS — F419 Anxiety disorder, unspecified: Secondary | ICD-10-CM | POA: Diagnosis not present

## 2020-03-18 DIAGNOSIS — F1310 Sedative, hypnotic or anxiolytic abuse, uncomplicated: Secondary | ICD-10-CM | POA: Diagnosis not present

## 2020-03-18 DIAGNOSIS — R45851 Suicidal ideations: Secondary | ICD-10-CM | POA: Diagnosis not present

## 2020-03-18 DIAGNOSIS — R030 Elevated blood-pressure reading, without diagnosis of hypertension: Secondary | ICD-10-CM | POA: Diagnosis not present

## 2020-03-18 DIAGNOSIS — K219 Gastro-esophageal reflux disease without esophagitis: Secondary | ICD-10-CM | POA: Diagnosis not present

## 2020-03-18 DIAGNOSIS — F1020 Alcohol dependence, uncomplicated: Secondary | ICD-10-CM | POA: Diagnosis not present

## 2020-03-18 DIAGNOSIS — G4700 Insomnia, unspecified: Secondary | ICD-10-CM | POA: Diagnosis not present

## 2020-03-18 DIAGNOSIS — F329 Major depressive disorder, single episode, unspecified: Secondary | ICD-10-CM | POA: Diagnosis not present

## 2020-03-22 DIAGNOSIS — G4700 Insomnia, unspecified: Secondary | ICD-10-CM | POA: Diagnosis not present

## 2020-03-22 DIAGNOSIS — F419 Anxiety disorder, unspecified: Secondary | ICD-10-CM | POA: Diagnosis not present

## 2020-03-22 DIAGNOSIS — R4586 Emotional lability: Secondary | ICD-10-CM | POA: Diagnosis not present

## 2020-03-22 DIAGNOSIS — R030 Elevated blood-pressure reading, without diagnosis of hypertension: Secondary | ICD-10-CM | POA: Diagnosis not present

## 2020-03-22 DIAGNOSIS — Z8616 Personal history of COVID-19: Secondary | ICD-10-CM | POA: Diagnosis not present

## 2020-03-22 DIAGNOSIS — R45851 Suicidal ideations: Secondary | ICD-10-CM | POA: Diagnosis not present

## 2020-03-22 DIAGNOSIS — F1020 Alcohol dependence, uncomplicated: Secondary | ICD-10-CM | POA: Diagnosis not present

## 2020-03-22 DIAGNOSIS — F1310 Sedative, hypnotic or anxiolytic abuse, uncomplicated: Secondary | ICD-10-CM | POA: Diagnosis not present

## 2020-03-22 DIAGNOSIS — K219 Gastro-esophageal reflux disease without esophagitis: Secondary | ICD-10-CM | POA: Diagnosis not present

## 2020-03-22 DIAGNOSIS — F329 Major depressive disorder, single episode, unspecified: Secondary | ICD-10-CM | POA: Diagnosis not present

## 2020-03-23 DIAGNOSIS — R030 Elevated blood-pressure reading, without diagnosis of hypertension: Secondary | ICD-10-CM | POA: Diagnosis not present

## 2020-03-23 DIAGNOSIS — F329 Major depressive disorder, single episode, unspecified: Secondary | ICD-10-CM | POA: Diagnosis not present

## 2020-03-23 DIAGNOSIS — F1020 Alcohol dependence, uncomplicated: Secondary | ICD-10-CM | POA: Diagnosis not present

## 2020-03-23 DIAGNOSIS — F419 Anxiety disorder, unspecified: Secondary | ICD-10-CM | POA: Diagnosis not present

## 2020-03-23 DIAGNOSIS — G4700 Insomnia, unspecified: Secondary | ICD-10-CM | POA: Diagnosis not present

## 2020-03-23 DIAGNOSIS — R45851 Suicidal ideations: Secondary | ICD-10-CM | POA: Diagnosis not present

## 2020-03-23 DIAGNOSIS — Z8616 Personal history of COVID-19: Secondary | ICD-10-CM | POA: Diagnosis not present

## 2020-03-23 DIAGNOSIS — F1310 Sedative, hypnotic or anxiolytic abuse, uncomplicated: Secondary | ICD-10-CM | POA: Diagnosis not present

## 2020-03-23 DIAGNOSIS — K219 Gastro-esophageal reflux disease without esophagitis: Secondary | ICD-10-CM | POA: Diagnosis not present

## 2020-03-23 DIAGNOSIS — R4586 Emotional lability: Secondary | ICD-10-CM | POA: Diagnosis not present

## 2020-03-25 DIAGNOSIS — R4586 Emotional lability: Secondary | ICD-10-CM | POA: Diagnosis not present

## 2020-03-25 DIAGNOSIS — F419 Anxiety disorder, unspecified: Secondary | ICD-10-CM | POA: Diagnosis not present

## 2020-03-25 DIAGNOSIS — K219 Gastro-esophageal reflux disease without esophagitis: Secondary | ICD-10-CM | POA: Diagnosis not present

## 2020-03-25 DIAGNOSIS — R030 Elevated blood-pressure reading, without diagnosis of hypertension: Secondary | ICD-10-CM | POA: Diagnosis not present

## 2020-03-25 DIAGNOSIS — F329 Major depressive disorder, single episode, unspecified: Secondary | ICD-10-CM | POA: Diagnosis not present

## 2020-03-25 DIAGNOSIS — F1020 Alcohol dependence, uncomplicated: Secondary | ICD-10-CM | POA: Diagnosis not present

## 2020-03-25 DIAGNOSIS — F1310 Sedative, hypnotic or anxiolytic abuse, uncomplicated: Secondary | ICD-10-CM | POA: Diagnosis not present

## 2020-03-25 DIAGNOSIS — Z8616 Personal history of COVID-19: Secondary | ICD-10-CM | POA: Diagnosis not present

## 2020-03-25 DIAGNOSIS — G4700 Insomnia, unspecified: Secondary | ICD-10-CM | POA: Diagnosis not present

## 2020-03-25 DIAGNOSIS — R45851 Suicidal ideations: Secondary | ICD-10-CM | POA: Diagnosis not present

## 2020-03-29 DIAGNOSIS — F1020 Alcohol dependence, uncomplicated: Secondary | ICD-10-CM | POA: Diagnosis not present

## 2020-03-29 DIAGNOSIS — K219 Gastro-esophageal reflux disease without esophagitis: Secondary | ICD-10-CM | POA: Diagnosis not present

## 2020-03-29 DIAGNOSIS — F329 Major depressive disorder, single episode, unspecified: Secondary | ICD-10-CM | POA: Diagnosis not present

## 2020-03-29 DIAGNOSIS — G4700 Insomnia, unspecified: Secondary | ICD-10-CM | POA: Diagnosis not present

## 2020-03-29 DIAGNOSIS — F419 Anxiety disorder, unspecified: Secondary | ICD-10-CM | POA: Diagnosis not present

## 2020-03-29 DIAGNOSIS — Z8616 Personal history of COVID-19: Secondary | ICD-10-CM | POA: Diagnosis not present

## 2020-03-29 DIAGNOSIS — R45851 Suicidal ideations: Secondary | ICD-10-CM | POA: Diagnosis not present

## 2020-03-29 DIAGNOSIS — R030 Elevated blood-pressure reading, without diagnosis of hypertension: Secondary | ICD-10-CM | POA: Diagnosis not present

## 2020-03-29 DIAGNOSIS — F1310 Sedative, hypnotic or anxiolytic abuse, uncomplicated: Secondary | ICD-10-CM | POA: Diagnosis not present

## 2020-03-29 DIAGNOSIS — R4586 Emotional lability: Secondary | ICD-10-CM | POA: Diagnosis not present

## 2020-03-30 DIAGNOSIS — F1020 Alcohol dependence, uncomplicated: Secondary | ICD-10-CM | POA: Diagnosis not present

## 2020-03-30 DIAGNOSIS — R45851 Suicidal ideations: Secondary | ICD-10-CM | POA: Diagnosis not present

## 2020-03-30 DIAGNOSIS — Z8616 Personal history of COVID-19: Secondary | ICD-10-CM | POA: Diagnosis not present

## 2020-03-30 DIAGNOSIS — K219 Gastro-esophageal reflux disease without esophagitis: Secondary | ICD-10-CM | POA: Diagnosis not present

## 2020-03-30 DIAGNOSIS — R030 Elevated blood-pressure reading, without diagnosis of hypertension: Secondary | ICD-10-CM | POA: Diagnosis not present

## 2020-03-30 DIAGNOSIS — G4700 Insomnia, unspecified: Secondary | ICD-10-CM | POA: Diagnosis not present

## 2020-03-30 DIAGNOSIS — F329 Major depressive disorder, single episode, unspecified: Secondary | ICD-10-CM | POA: Diagnosis not present

## 2020-03-30 DIAGNOSIS — F419 Anxiety disorder, unspecified: Secondary | ICD-10-CM | POA: Diagnosis not present

## 2020-03-30 DIAGNOSIS — F1310 Sedative, hypnotic or anxiolytic abuse, uncomplicated: Secondary | ICD-10-CM | POA: Diagnosis not present

## 2020-03-30 DIAGNOSIS — R4586 Emotional lability: Secondary | ICD-10-CM | POA: Diagnosis not present

## 2020-04-01 DIAGNOSIS — F419 Anxiety disorder, unspecified: Secondary | ICD-10-CM | POA: Diagnosis not present

## 2020-04-01 DIAGNOSIS — F1020 Alcohol dependence, uncomplicated: Secondary | ICD-10-CM | POA: Diagnosis not present

## 2020-04-01 DIAGNOSIS — F329 Major depressive disorder, single episode, unspecified: Secondary | ICD-10-CM | POA: Diagnosis not present

## 2020-04-01 DIAGNOSIS — K219 Gastro-esophageal reflux disease without esophagitis: Secondary | ICD-10-CM | POA: Diagnosis not present

## 2020-04-01 DIAGNOSIS — R030 Elevated blood-pressure reading, without diagnosis of hypertension: Secondary | ICD-10-CM | POA: Diagnosis not present

## 2020-04-01 DIAGNOSIS — Z8616 Personal history of COVID-19: Secondary | ICD-10-CM | POA: Diagnosis not present

## 2020-04-01 DIAGNOSIS — G4700 Insomnia, unspecified: Secondary | ICD-10-CM | POA: Diagnosis not present

## 2020-04-01 DIAGNOSIS — R4586 Emotional lability: Secondary | ICD-10-CM | POA: Diagnosis not present

## 2020-04-01 DIAGNOSIS — R45851 Suicidal ideations: Secondary | ICD-10-CM | POA: Diagnosis not present

## 2020-04-01 DIAGNOSIS — F1310 Sedative, hypnotic or anxiolytic abuse, uncomplicated: Secondary | ICD-10-CM | POA: Diagnosis not present

## 2020-04-05 DIAGNOSIS — F329 Major depressive disorder, single episode, unspecified: Secondary | ICD-10-CM | POA: Diagnosis not present

## 2020-04-05 DIAGNOSIS — F419 Anxiety disorder, unspecified: Secondary | ICD-10-CM | POA: Diagnosis not present

## 2020-04-05 DIAGNOSIS — R45851 Suicidal ideations: Secondary | ICD-10-CM | POA: Diagnosis not present

## 2020-04-05 DIAGNOSIS — F1020 Alcohol dependence, uncomplicated: Secondary | ICD-10-CM | POA: Diagnosis not present

## 2020-04-05 DIAGNOSIS — Z8616 Personal history of COVID-19: Secondary | ICD-10-CM | POA: Diagnosis not present

## 2020-04-05 DIAGNOSIS — F1310 Sedative, hypnotic or anxiolytic abuse, uncomplicated: Secondary | ICD-10-CM | POA: Diagnosis not present

## 2020-04-05 DIAGNOSIS — G4700 Insomnia, unspecified: Secondary | ICD-10-CM | POA: Diagnosis not present

## 2020-04-05 DIAGNOSIS — R030 Elevated blood-pressure reading, without diagnosis of hypertension: Secondary | ICD-10-CM | POA: Diagnosis not present

## 2020-04-05 DIAGNOSIS — R4586 Emotional lability: Secondary | ICD-10-CM | POA: Diagnosis not present

## 2020-04-05 DIAGNOSIS — K219 Gastro-esophageal reflux disease without esophagitis: Secondary | ICD-10-CM | POA: Diagnosis not present

## 2020-04-06 DIAGNOSIS — F1310 Sedative, hypnotic or anxiolytic abuse, uncomplicated: Secondary | ICD-10-CM | POA: Diagnosis not present

## 2020-04-06 DIAGNOSIS — F1020 Alcohol dependence, uncomplicated: Secondary | ICD-10-CM | POA: Diagnosis not present

## 2020-04-06 DIAGNOSIS — R4586 Emotional lability: Secondary | ICD-10-CM | POA: Diagnosis not present

## 2020-04-06 DIAGNOSIS — Z8616 Personal history of COVID-19: Secondary | ICD-10-CM | POA: Diagnosis not present

## 2020-04-06 DIAGNOSIS — R030 Elevated blood-pressure reading, without diagnosis of hypertension: Secondary | ICD-10-CM | POA: Diagnosis not present

## 2020-04-06 DIAGNOSIS — G4700 Insomnia, unspecified: Secondary | ICD-10-CM | POA: Diagnosis not present

## 2020-04-06 DIAGNOSIS — F329 Major depressive disorder, single episode, unspecified: Secondary | ICD-10-CM | POA: Diagnosis not present

## 2020-04-06 DIAGNOSIS — K219 Gastro-esophageal reflux disease without esophagitis: Secondary | ICD-10-CM | POA: Diagnosis not present

## 2020-04-06 DIAGNOSIS — R45851 Suicidal ideations: Secondary | ICD-10-CM | POA: Diagnosis not present

## 2020-04-06 DIAGNOSIS — F419 Anxiety disorder, unspecified: Secondary | ICD-10-CM | POA: Diagnosis not present

## 2020-04-08 DIAGNOSIS — F1310 Sedative, hypnotic or anxiolytic abuse, uncomplicated: Secondary | ICD-10-CM | POA: Diagnosis not present

## 2020-04-08 DIAGNOSIS — F329 Major depressive disorder, single episode, unspecified: Secondary | ICD-10-CM | POA: Diagnosis not present

## 2020-04-08 DIAGNOSIS — Z8616 Personal history of COVID-19: Secondary | ICD-10-CM | POA: Diagnosis not present

## 2020-04-08 DIAGNOSIS — R4586 Emotional lability: Secondary | ICD-10-CM | POA: Diagnosis not present

## 2020-04-08 DIAGNOSIS — R030 Elevated blood-pressure reading, without diagnosis of hypertension: Secondary | ICD-10-CM | POA: Diagnosis not present

## 2020-04-08 DIAGNOSIS — F1020 Alcohol dependence, uncomplicated: Secondary | ICD-10-CM | POA: Diagnosis not present

## 2020-04-08 DIAGNOSIS — K219 Gastro-esophageal reflux disease without esophagitis: Secondary | ICD-10-CM | POA: Diagnosis not present

## 2020-04-08 DIAGNOSIS — R45851 Suicidal ideations: Secondary | ICD-10-CM | POA: Diagnosis not present

## 2020-04-08 DIAGNOSIS — G4700 Insomnia, unspecified: Secondary | ICD-10-CM | POA: Diagnosis not present

## 2020-04-08 DIAGNOSIS — F419 Anxiety disorder, unspecified: Secondary | ICD-10-CM | POA: Diagnosis not present

## 2020-04-12 DIAGNOSIS — R4586 Emotional lability: Secondary | ICD-10-CM | POA: Diagnosis not present

## 2020-04-12 DIAGNOSIS — F1310 Sedative, hypnotic or anxiolytic abuse, uncomplicated: Secondary | ICD-10-CM | POA: Diagnosis not present

## 2020-04-12 DIAGNOSIS — F329 Major depressive disorder, single episode, unspecified: Secondary | ICD-10-CM | POA: Diagnosis not present

## 2020-04-12 DIAGNOSIS — R030 Elevated blood-pressure reading, without diagnosis of hypertension: Secondary | ICD-10-CM | POA: Diagnosis not present

## 2020-04-12 DIAGNOSIS — R45851 Suicidal ideations: Secondary | ICD-10-CM | POA: Diagnosis not present

## 2020-04-12 DIAGNOSIS — G4700 Insomnia, unspecified: Secondary | ICD-10-CM | POA: Diagnosis not present

## 2020-04-12 DIAGNOSIS — F419 Anxiety disorder, unspecified: Secondary | ICD-10-CM | POA: Diagnosis not present

## 2020-04-12 DIAGNOSIS — F1020 Alcohol dependence, uncomplicated: Secondary | ICD-10-CM | POA: Diagnosis not present

## 2020-04-12 DIAGNOSIS — Z8616 Personal history of COVID-19: Secondary | ICD-10-CM | POA: Diagnosis not present

## 2020-04-12 DIAGNOSIS — K219 Gastro-esophageal reflux disease without esophagitis: Secondary | ICD-10-CM | POA: Diagnosis not present

## 2020-04-13 DIAGNOSIS — Z8616 Personal history of COVID-19: Secondary | ICD-10-CM | POA: Diagnosis not present

## 2020-04-13 DIAGNOSIS — K219 Gastro-esophageal reflux disease without esophagitis: Secondary | ICD-10-CM | POA: Diagnosis not present

## 2020-04-13 DIAGNOSIS — R4586 Emotional lability: Secondary | ICD-10-CM | POA: Diagnosis not present

## 2020-04-13 DIAGNOSIS — R030 Elevated blood-pressure reading, without diagnosis of hypertension: Secondary | ICD-10-CM | POA: Diagnosis not present

## 2020-04-13 DIAGNOSIS — R45851 Suicidal ideations: Secondary | ICD-10-CM | POA: Diagnosis not present

## 2020-04-13 DIAGNOSIS — G4700 Insomnia, unspecified: Secondary | ICD-10-CM | POA: Diagnosis not present

## 2020-04-13 DIAGNOSIS — F419 Anxiety disorder, unspecified: Secondary | ICD-10-CM | POA: Diagnosis not present

## 2020-04-13 DIAGNOSIS — F329 Major depressive disorder, single episode, unspecified: Secondary | ICD-10-CM | POA: Diagnosis not present

## 2020-04-13 DIAGNOSIS — F1020 Alcohol dependence, uncomplicated: Secondary | ICD-10-CM | POA: Diagnosis not present

## 2020-04-13 DIAGNOSIS — F1310 Sedative, hypnotic or anxiolytic abuse, uncomplicated: Secondary | ICD-10-CM | POA: Diagnosis not present

## 2020-04-15 DIAGNOSIS — Z8616 Personal history of COVID-19: Secondary | ICD-10-CM | POA: Diagnosis not present

## 2020-04-15 DIAGNOSIS — K219 Gastro-esophageal reflux disease without esophagitis: Secondary | ICD-10-CM | POA: Diagnosis not present

## 2020-04-15 DIAGNOSIS — R030 Elevated blood-pressure reading, without diagnosis of hypertension: Secondary | ICD-10-CM | POA: Diagnosis not present

## 2020-04-15 DIAGNOSIS — R45851 Suicidal ideations: Secondary | ICD-10-CM | POA: Diagnosis not present

## 2020-04-15 DIAGNOSIS — R4586 Emotional lability: Secondary | ICD-10-CM | POA: Diagnosis not present

## 2020-04-15 DIAGNOSIS — F419 Anxiety disorder, unspecified: Secondary | ICD-10-CM | POA: Diagnosis not present

## 2020-04-15 DIAGNOSIS — F1310 Sedative, hypnotic or anxiolytic abuse, uncomplicated: Secondary | ICD-10-CM | POA: Diagnosis not present

## 2020-04-15 DIAGNOSIS — G4700 Insomnia, unspecified: Secondary | ICD-10-CM | POA: Diagnosis not present

## 2020-04-15 DIAGNOSIS — F329 Major depressive disorder, single episode, unspecified: Secondary | ICD-10-CM | POA: Diagnosis not present

## 2020-04-15 DIAGNOSIS — F1020 Alcohol dependence, uncomplicated: Secondary | ICD-10-CM | POA: Diagnosis not present

## 2020-04-19 DIAGNOSIS — Z8616 Personal history of COVID-19: Secondary | ICD-10-CM | POA: Diagnosis not present

## 2020-04-19 DIAGNOSIS — R4586 Emotional lability: Secondary | ICD-10-CM | POA: Diagnosis not present

## 2020-04-19 DIAGNOSIS — F419 Anxiety disorder, unspecified: Secondary | ICD-10-CM | POA: Diagnosis not present

## 2020-04-19 DIAGNOSIS — G4700 Insomnia, unspecified: Secondary | ICD-10-CM | POA: Diagnosis not present

## 2020-04-19 DIAGNOSIS — F1310 Sedative, hypnotic or anxiolytic abuse, uncomplicated: Secondary | ICD-10-CM | POA: Diagnosis not present

## 2020-04-19 DIAGNOSIS — R030 Elevated blood-pressure reading, without diagnosis of hypertension: Secondary | ICD-10-CM | POA: Diagnosis not present

## 2020-04-19 DIAGNOSIS — R45851 Suicidal ideations: Secondary | ICD-10-CM | POA: Diagnosis not present

## 2020-04-19 DIAGNOSIS — F329 Major depressive disorder, single episode, unspecified: Secondary | ICD-10-CM | POA: Diagnosis not present

## 2020-04-19 DIAGNOSIS — F1020 Alcohol dependence, uncomplicated: Secondary | ICD-10-CM | POA: Diagnosis not present

## 2020-04-19 DIAGNOSIS — K219 Gastro-esophageal reflux disease without esophagitis: Secondary | ICD-10-CM | POA: Diagnosis not present

## 2020-04-20 DIAGNOSIS — F1020 Alcohol dependence, uncomplicated: Secondary | ICD-10-CM | POA: Diagnosis not present

## 2020-04-20 DIAGNOSIS — F1310 Sedative, hypnotic or anxiolytic abuse, uncomplicated: Secondary | ICD-10-CM | POA: Diagnosis not present

## 2020-04-20 DIAGNOSIS — K219 Gastro-esophageal reflux disease without esophagitis: Secondary | ICD-10-CM | POA: Diagnosis not present

## 2020-04-20 DIAGNOSIS — G4700 Insomnia, unspecified: Secondary | ICD-10-CM | POA: Diagnosis not present

## 2020-04-20 DIAGNOSIS — R030 Elevated blood-pressure reading, without diagnosis of hypertension: Secondary | ICD-10-CM | POA: Diagnosis not present

## 2020-04-20 DIAGNOSIS — R45851 Suicidal ideations: Secondary | ICD-10-CM | POA: Diagnosis not present

## 2020-04-20 DIAGNOSIS — Z8616 Personal history of COVID-19: Secondary | ICD-10-CM | POA: Diagnosis not present

## 2020-04-20 DIAGNOSIS — R4586 Emotional lability: Secondary | ICD-10-CM | POA: Diagnosis not present

## 2020-04-20 DIAGNOSIS — F329 Major depressive disorder, single episode, unspecified: Secondary | ICD-10-CM | POA: Diagnosis not present

## 2020-04-20 DIAGNOSIS — F419 Anxiety disorder, unspecified: Secondary | ICD-10-CM | POA: Diagnosis not present

## 2020-04-22 DIAGNOSIS — F329 Major depressive disorder, single episode, unspecified: Secondary | ICD-10-CM | POA: Diagnosis not present

## 2020-04-22 DIAGNOSIS — K219 Gastro-esophageal reflux disease without esophagitis: Secondary | ICD-10-CM | POA: Diagnosis not present

## 2020-04-22 DIAGNOSIS — G4700 Insomnia, unspecified: Secondary | ICD-10-CM | POA: Diagnosis not present

## 2020-04-22 DIAGNOSIS — R030 Elevated blood-pressure reading, without diagnosis of hypertension: Secondary | ICD-10-CM | POA: Diagnosis not present

## 2020-04-22 DIAGNOSIS — F1020 Alcohol dependence, uncomplicated: Secondary | ICD-10-CM | POA: Diagnosis not present

## 2020-04-22 DIAGNOSIS — R45851 Suicidal ideations: Secondary | ICD-10-CM | POA: Diagnosis not present

## 2020-04-22 DIAGNOSIS — R4586 Emotional lability: Secondary | ICD-10-CM | POA: Diagnosis not present

## 2020-04-22 DIAGNOSIS — F1310 Sedative, hypnotic or anxiolytic abuse, uncomplicated: Secondary | ICD-10-CM | POA: Diagnosis not present

## 2020-04-22 DIAGNOSIS — F419 Anxiety disorder, unspecified: Secondary | ICD-10-CM | POA: Diagnosis not present

## 2020-04-22 DIAGNOSIS — Z8616 Personal history of COVID-19: Secondary | ICD-10-CM | POA: Diagnosis not present

## 2020-05-11 DIAGNOSIS — M25512 Pain in left shoulder: Secondary | ICD-10-CM | POA: Diagnosis not present

## 2020-05-11 DIAGNOSIS — F418 Other specified anxiety disorders: Secondary | ICD-10-CM | POA: Diagnosis not present

## 2020-05-31 ENCOUNTER — Other Ambulatory Visit: Payer: Self-pay

## 2020-05-31 ENCOUNTER — Ambulatory Visit (INDEPENDENT_AMBULATORY_CARE_PROVIDER_SITE_OTHER): Payer: BC Managed Care – PPO | Admitting: Behavioral Health

## 2020-05-31 ENCOUNTER — Encounter: Payer: Self-pay | Admitting: Behavioral Health

## 2020-05-31 VITALS — BP 133/85 | HR 59 | Ht 71.0 in | Wt 202.0 lb

## 2020-05-31 DIAGNOSIS — F1010 Alcohol abuse, uncomplicated: Secondary | ICD-10-CM

## 2020-05-31 DIAGNOSIS — F331 Major depressive disorder, recurrent, moderate: Secondary | ICD-10-CM

## 2020-05-31 DIAGNOSIS — F411 Generalized anxiety disorder: Secondary | ICD-10-CM

## 2020-05-31 MED ORDER — ARIPIPRAZOLE 10 MG PO TABS: 10.0000 mg | ORAL_TABLET | Freq: Every day | ORAL | 1 refills | Status: DC
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
## 2020-06-29 ENCOUNTER — Encounter: Payer: Self-pay | Admitting: Behavioral Health

## 2020-06-29 ENCOUNTER — Other Ambulatory Visit: Payer: Self-pay

## 2020-06-29 ENCOUNTER — Ambulatory Visit (INDEPENDENT_AMBULATORY_CARE_PROVIDER_SITE_OTHER): Payer: BC Managed Care – PPO | Admitting: Behavioral Health

## 2020-06-29 DIAGNOSIS — F331 Major depressive disorder, recurrent, moderate: Secondary | ICD-10-CM | POA: Diagnosis not present

## 2020-06-29 DIAGNOSIS — F1010 Alcohol abuse, uncomplicated: Secondary | ICD-10-CM

## 2020-06-29 DIAGNOSIS — F411 Generalized anxiety disorder: Secondary | ICD-10-CM | POA: Diagnosis not present

## 2020-06-29 NOTE — Progress Notes (Signed)
[facility] Med Check  Patient ID: Joseph Eaton,  MRN: [id number]  PCP: Farris Has, MD  Date of Evaluation: 06/29/2020 Time spent:30 minutes  Chief Complaint:  Chief Complaint    Anxiety; Depression; Alcohol Problem      HISTORY/CURRENT STATUS: HPI  40 year old patient presents to this office for follow up and medication management. He says that he is doing well and his anxiety has been controlled. Says that family life has been improving . He is still seeing counselor and wife is seeing individual counseling as well, but they are still looking for family therapy. Says he still is active in AA weekly and Bible study. He does not want to make any medication changes at this time due to feeling stable.  Reports anxiety 2, depression 2. Sleeping 7-8 hours per evening. No mania, psychosis. No SI/HI.   Individual Medical History/ Review of Systems: Changes? :No   Allergies: Patient has no known allergies.  Current Medications:  Current Outpatient Medications:  .  ARIPiprazole (ABILIFY) 10 MG tablet, Take 1 tablet (10 mg total) by mouth daily., Disp: 30 tablet, Rfl: 1 .  diphenoxylate-atropine (LOMOTIL) 2.5-0.025 MG tablet, Take 2 tablets by mouth 4 (four) times daily as needed for diarrhea or loose stools., Disp: 20 tablet, Rfl: 0 .  fluticasone (FLONASE) 50 MCG/ACT nasal spray, USE 1 SPRAY IN EACH NOSTRIL EVERY DAY, Disp: , Rfl:  .  losartan (COZAAR) 50 MG tablet, 1 tablet, Disp: , Rfl:  .  omeprazole (PRILOSEC OTC) 20 MG tablet, Take 20 mg by mouth daily., Disp: , Rfl:  .  promethazine (PHENERGAN) 25 MG tablet, Take 1 tablet (25 mg total) by mouth every 6 (six) hours as needed for nausea or vomiting., Disp: 15 tablet, Rfl: 0 .  propranolol (INDERAL) 10 MG tablet, 1 tablet, Disp: , Rfl:  .  sertraline (ZOLOFT) 100 MG tablet, Take 100 mg by mouth daily., Disp: , Rfl: 5 .  traZODone (DESYREL) 100 MG tablet, 1 tablet at bedtime., Disp: , Rfl:  Medication Side Effects:  none  Family Medical/ Social History: Changes?no  MENTAL HEALTH EXAM:  There were no vitals taken for this visit.There is no height or weight on file to calculate BMI.  General Appearance: Neat and Well Groomed  Eye Contact:  Good  Speech:  Normal Rate  Volume:  Normal  Mood:  NA and Anxious  Affect:  Appropriate  Thought Process:  Coherent  Orientation:  Full (Time, Place, and Person)  Thought Content: Logical   Suicidal Thoughts:  No  Homicidal Thoughts:  No  Memory:  WNL  Judgement:  Good  Insight:  Good  Psychomotor Activity:  Normal  Concentration:  Concentration: Good  Recall:  Good  Fund of Knowledge: Good  Language: Good  Assets:  Desire for Improvement  ADL's:  Intact  Cognition: WNL  Prognosis:  Good    DIAGNOSES:    ICD-10-CM   1. Generalized anxiety disorder  F41.1   2. Major depressive disorder, recurrent episode, moderate (HCC)  F33.1   3. Alcohol abuse  F10.10     Receiving Psychotherapy: Yes    RECOMMENDATIONS:  Continue Abilify 10 mg daily Continue Zoloft 100 mg daily Will report and new side effects or worsening symptoms Provided after hours emergency contact information To follow up in 3 months to reassess. Greater than 50% of 30 min.face to face time with patient was spent on counseling and coordination of care. We discussed seeking individual, and family therapy, locating additional resources during  recovery. Discussed long term use of antipsychotic medication. Discussed potential metabolic side effects associated with atypical antipsychotics, as well as potential risk for movement side effects. Advised pt to contact office if movement side effects occur.      Joan Flores, NP

## 2020-09-01 DIAGNOSIS — K219 Gastro-esophageal reflux disease without esophagitis: Secondary | ICD-10-CM | POA: Diagnosis not present

## 2020-09-01 DIAGNOSIS — F418 Other specified anxiety disorders: Secondary | ICD-10-CM | POA: Diagnosis not present

## 2020-09-28 ENCOUNTER — Other Ambulatory Visit: Payer: Self-pay

## 2020-09-28 ENCOUNTER — Encounter: Payer: Self-pay | Admitting: Behavioral Health

## 2020-09-28 ENCOUNTER — Ambulatory Visit (INDEPENDENT_AMBULATORY_CARE_PROVIDER_SITE_OTHER): Payer: BC Managed Care – PPO | Admitting: Behavioral Health

## 2020-09-28 DIAGNOSIS — F411 Generalized anxiety disorder: Secondary | ICD-10-CM

## 2020-09-28 DIAGNOSIS — F330 Major depressive disorder, recurrent, mild: Secondary | ICD-10-CM | POA: Diagnosis not present

## 2020-09-28 MED ORDER — SERTRALINE HCL 100 MG PO TABS: 100.0000 mg | ORAL_TABLET | Freq: Every day | ORAL | 3 refills | Status: DC

## 2020-09-28 MED ORDER — BUSPIRONE HCL 15 MG PO TABS: ORAL_TABLET | ORAL | 1 refills | Status: DC

## 2020-09-28 NOTE — Progress Notes (Signed)
[facility] Med Check  Patient ID: Joseph Eaton,  MRN: [id number]  PCP: Farris Has, MD  Date of Evaluation: 09/28/2020 Time spent:30 minutes  Chief Complaint:  Chief Complaint   Anxiety; Depression; Follow-up; Medication Refill     HISTORY/CURRENT STATUS: HPI 40 year old patient presents to this office for follow up and medication management. He says that he is doing well and his anxiety has been controlled until last couple of weeks where he says his anxiety and depression have returned without known triggers. Says everything has been going well in his life. Says that family life has been improving. Says he still is active in AA weekly and Bible study. He feels like it is time for medication adjustments at this time.  Reports anxiety 7/10, depression 7/10. Sleeping 7-8 hours per evening. No mania, psychosis. No SI/HI.  Prior psychiatric medication trials: Abilify Inderal   Individual Medical History/ Review of Systems: Changes? :No   Allergies: Patient has no known allergies.  Current Medications:  Current Outpatient Medications:    busPIRone (BUSPAR) 15 MG tablet, Take 1/3 tablet p.o. twice daily for 1 week, then take 2/3 tablet p.o. twice daily for 1 week, then take 1 tablet p.o. twice daily, Disp: 60 tablet, Rfl: 1   ARIPiprazole (ABILIFY) 10 MG tablet, Take 1 tablet (10 mg total) by mouth daily., Disp: 30 tablet, Rfl: 1   fluticasone (FLONASE) 50 MCG/ACT nasal spray, USE 1 SPRAY IN EACH NOSTRIL EVERY DAY, Disp: , Rfl:    omeprazole (PRILOSEC OTC) 20 MG tablet, Take 20 mg by mouth daily., Disp: , Rfl:    sertraline (ZOLOFT) 100 MG tablet, Take 1 tablet (100 mg total) by mouth daily., Disp: 45 tablet, Rfl: 3   traZODone (DESYREL) 100 MG tablet, 1 tablet at bedtime., Disp: , Rfl:  Medication Side Effects: none  Family Medical/ Social History: Changes? No  MENTAL HEALTH EXAM:  There were no vitals taken for this visit.There is no height or weight on file to  calculate BMI.  General Appearance: Casual, Neat, and Well Groomed  Eye Contact:  Good  Speech:  Clear and Coherent  Volume:  Normal  Mood:  Anxious and Depressed  Affect:  Non-Congruent, Depressed, and Anxious  Thought Process:  Coherent  Orientation:  Full (Time, Place, and Person)  Thought Content: Logical   Suicidal Thoughts:  No  Homicidal Thoughts:  No  Memory:  WNL  Judgement:  Good  Insight:  Good  Psychomotor Activity:  Normal  Concentration:  Concentration: Good  Recall:  Good  Fund of Knowledge: Good  Language: Good  Assets:  Desire for Improvement  ADL's:  Intact  Cognition: WNL  Prognosis:  Good    DIAGNOSES:    ICD-10-CM   1. Generalized anxiety disorder  F41.1 sertraline (ZOLOFT) 100 MG tablet    busPIRone (BUSPAR) 15 MG tablet    2. Mild episode of recurrent major depressive disorder (HCC)  F33.0 sertraline (ZOLOFT) 100 MG tablet    busPIRone (BUSPAR) 15 MG tablet      Receiving Psychotherapy: No    RECOMMENDATIONS:   Patient had self reduced Abilify to 5 mg for last two weeks with a desire to wean off medication. Im in agreement that this is reasonable considering his current condition. Will reduce to 2.5 mg for two weeks and then stop. To increase Zoloft to 150 mg daily Discussed potential benefits, risks, and side effects of BuSpar.  Patient agrees to trial of BuSpar.   Will start BuSpar 15 mg  1/3 tablet twice daily for 1 week, then increase to 2/3 tablet twice daily for 1 week, then increase to 1 tablet twice daily for anxiety.  Will report and new side effects or worsening symptoms Provided after hours emergency contact information To follow up in 4 weeks to reassess. Greater than 50% of 30 min.face to face time with patient was spent on counseling and coordination of care. Discussed overall improvement with recent setback with increase in anxiety.          Joan Flores, NP

## 2020-10-26 ENCOUNTER — Encounter: Payer: Self-pay | Admitting: Behavioral Health

## 2020-10-26 ENCOUNTER — Ambulatory Visit (INDEPENDENT_AMBULATORY_CARE_PROVIDER_SITE_OTHER): Payer: BC Managed Care – PPO | Admitting: Behavioral Health

## 2020-10-26 ENCOUNTER — Other Ambulatory Visit: Payer: Self-pay

## 2020-10-26 DIAGNOSIS — F411 Generalized anxiety disorder: Secondary | ICD-10-CM

## 2020-10-26 DIAGNOSIS — F3341 Major depressive disorder, recurrent, in partial remission: Secondary | ICD-10-CM

## 2020-10-26 DIAGNOSIS — F1021 Alcohol dependence, in remission: Secondary | ICD-10-CM

## 2020-10-26 DIAGNOSIS — F330 Major depressive disorder, recurrent, mild: Secondary | ICD-10-CM | POA: Diagnosis not present

## 2020-10-26 MED ORDER — BUSPIRONE HCL 15 MG PO TABS: ORAL_TABLET | ORAL | 3 refills | Status: DC

## 2020-10-26 MED ORDER — SERTRALINE HCL 100 MG PO TABS: 100.0000 mg | ORAL_TABLET | Freq: Every day | ORAL | 3 refills | Status: DC

## 2020-10-26 NOTE — Progress Notes (Signed)
[facility]  Patient ID: Joseph Eaton,  MRN: [id number]  PCP: Farris Has, MD  Date of Evaluation: 10/26/2020 Time spent:20 minutes  Chief Complaint:  Chief Complaint   Depression; Anxiety; Follow-up; Medication Refill     HISTORY/CURRENT STATUS: HPI  40 year old patient presents to this office for follow up and medication management. He says that he is doing well and his anxiety has been controlled. He says there has been a few situations on while on family vacation he experienced increased anxiety from work stress. He still has been maintaining sobriety. Says everything has been going well in his life. Says that family life has been improving. Says that he is interviewing for promotion. Says he still is active in AA weekly and Bible study.He temporarily backed down on the Buspar to 7.5 twice daily because he said it was making him feel dull. He was only on 15 mg twice daily for 3 days before reducing the med. He says he realizes now that he probably did not give enough time to adjust to the medication.  Reports anxiety 4/10, depression 2/10. Sleeping 7-8 hours per evening. No mania, psychosis. No SI/HI.   Prior psychiatric medication trials: Abilify Inderal Prozac Effexor  Individual Medical History/ Review of Systems: Changes? :No   Allergies: Patient has no known allergies.  Current Medications:  Current Outpatient Medications:    ARIPiprazole (ABILIFY) 10 MG tablet, Take 1 tablet (10 mg total) by mouth daily., Disp: 30 tablet, Rfl: 1   busPIRone (BUSPAR) 15 MG tablet, Take 1/3 tablet p.o. twice daily for 1 week, then take 2/3 tablet p.o. twice daily for 1 week, then take 1 tablet p.o. twice daily, Disp: 60 tablet, Rfl: 1   fluticasone (FLONASE) 50 MCG/ACT nasal spray, USE 1 SPRAY IN EACH NOSTRIL EVERY DAY, Disp: , Rfl:    omeprazole (PRILOSEC OTC) 20 MG tablet, Take 20 mg by mouth daily., Disp: , Rfl:    sertraline (ZOLOFT) 100 MG tablet, Take 1 tablet (100  mg total) by mouth daily., Disp: 45 tablet, Rfl: 3   traZODone (DESYREL) 100 MG tablet, 1 tablet at bedtime., Disp: , Rfl:  Medication Side Effects: anxiety  Family Medical/ Social History: Changes? No  MENTAL HEALTH EXAM:  There were no vitals taken for this visit.There is no height or weight on file to calculate BMI.  General Appearance: Casual and Well Groomed  Eye Contact:  Good  Speech:  Clear and Coherent  Volume:  Normal  Mood:  Anxious  Affect:  Appropriate  Thought Process:  Coherent  Orientation:  Full (Time, Place, and Person)  Thought Content: Logical   Suicidal Thoughts:  No  Homicidal Thoughts:  No  Memory:  WNL  Judgement:  Good  Insight:  Good  Psychomotor Activity:  Normal  Concentration:  Concentration: Good  Recall:  Good  Fund of Knowledge: Good  Language: Good  Assets:  Desire for Improvement  ADL's:  Intact  Cognition: WNL  Prognosis:  Good    DIAGNOSES:    ICD-10-CM   1. Generalized anxiety disorder  F41.1     2. Recurrent major depressive disorder, in partial remission (HCC)  F33.41     3. 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  Patient agrees to trial of BuSpar.   Continue Buspar 15 mg twice daily  Will report and new side effects or worsening symptoms Provided after hours emergency contact information To follow up in 3 months to reassess. Greater than 50% of 20 min.face to face time with patient was spent on counseling and coordination of care. Discussed overall improvement with anxiety. Discussed work dynamics and coping with stressors on the job. He is still participating in AA regularly and currently continues in remission.      Joan Flores, NP

## 2020-11-22 DIAGNOSIS — K4090 Unilateral inguinal hernia, without obstruction or gangrene, not specified as recurrent: Secondary | ICD-10-CM | POA: Diagnosis not present

## 2020-12-30 ENCOUNTER — Other Ambulatory Visit: Payer: Self-pay

## 2020-12-30 ENCOUNTER — Ambulatory Visit: Payer: Self-pay | Admitting: Surgery

## 2020-12-30 ENCOUNTER — Encounter (HOSPITAL_COMMUNITY): Payer: Self-pay | Admitting: Surgery

## 2020-12-30 DIAGNOSIS — K4090 Unilateral inguinal hernia, without obstruction or gangrene, not specified as recurrent: Secondary | ICD-10-CM | POA: Diagnosis not present

## 2020-12-30 NOTE — Progress Notes (Addendum)
COVID swab appointment:  N/A  COVID Vaccine Completed:  Yes x1 Date COVID Vaccine completed: Has received booster: COVID vaccine manufacturer:   [employer]   Date of COVID positive in last 90 days:  No  PCP - Farris Has, MD Cardiologist - N/A  Chest x-ray - N/A EKG - N/A Stress Test - N/A ECHO - N/A Cardiac Cath - N/A Pacemaker/ICD device last checked: Spinal Cord Stimulator:  Sleep Study - No.  Stop Bang 5 CPAP -   Fasting Blood Sugar - N/A Checks Blood Sugar _____ times a day  Blood Thinner Instructions:N/A Aspirin Instructions: Last Dose:  Activity level:  Can go up a flight of stairs and perform activities of daily living without stopping and without symptoms of chest pain or shortness of breath.    Anesthesia review:  Stop Bang 5  Patient denies shortness of breath, fever, cough and chest pain at PAT appointment (completed over the phone)  Patient verbalized understanding of instructions that were given to them at the PAT appointment. Patient was also instructed that they will need to review over the PAT instructions again at home before surgery.

## 2020-12-30 NOTE — H&P (View-Only) (Signed)
     Joseph Eaton [id number]   Referring Provider:  Emeterio Reeve, MD   Subjective   Chief Complaint: hernia    History of Present Illness:    40 year old man with history of anxiety, depression, hypertension, alcoholism in recovery, and right pneumothorax presents for evaluation of a left inguinal hernia.  He has no previous abdominal surgical history. He presented to his primary care doctor about a month ago with left groin pain for the preceding 10 weeks but no palpable mass or protrusion and was found on exam to have a hernia.  Still has some discomfort, but has not noted any change otherwise. He works in [employer].   Review of Systems: A complete review of systems was obtained from the patient.  I have reviewed this information and discussed as appropriate with the patient.  See HPI as well for other ROS.   Medical History: Past Medical History:  Diagnosis Date   Anxiety    Asthma, unspecified asthma severity, unspecified whether complicated, unspecified whether persistent    GERD (gastroesophageal reflux disease)     There is no problem list on file for this patient.   History reviewed. No pertinent surgical history.   No Known Allergies  Current Outpatient Medications on File Prior to Visit  Medication Sig Dispense Refill   busPIRone (BUSPAR) 15 MG tablet 1 tablet (14.9999 mg total)     fluticasone propionate (FLONASE) 50 mcg/actuation nasal spray Place 1 spray into both nostrils once daily     traZODone (DESYREL) 100 MG tablet Take 1 tablet (100 mg total) by mouth at bedtime     No current facility-administered medications on file prior to visit.    History reviewed. No pertinent family history.   Social History   Tobacco Use  Smoking Status Never  Smokeless Tobacco Never     Social History   Socioeconomic History   Marital status: Single  Tobacco Use   Smoking status: Never   Smokeless tobacco: Never  Vaping Use   Vaping  Use: Never used  Substance and Sexual Activity   Alcohol use: Not Currently   Drug use: Not Currently   Sexual activity: Yes    Objective:    Vitals:   12/30/20 0959  BP: 110/70  Pulse: 91  Temp: 36.7 C (98.1 F)  SpO2: 98%  Weight: 86.9 kg (191 lb 9.6 oz)  Height: 180.3 cm (5\' 11" )    Body mass index is 26.72 kg/m.  Alert and well-appearing Unlabored respirations Abdomen soft and nontender.  Small left inguinal hernia with Valsalva.  No hernia on the right.  Assessment and Plan:  Diagnoses and all orders for this visit:  Inguinal hernia without obstruction or gangrene, recurrence not specified, unspecified laterality -     CCS Case Posting Request; Future   I recommend proceeding with an open repair for this unilateral nonrecurrent hernia. We discussed the relevant anatomy and we discussed the technique of the procedure.  Discussed risks of bleeding, infection, pain, scarring, injury to structures in the area including nerves, blood vessels, bowel, bladder, risk of chronic pain, hernia recurrence, risk of seroma or hematoma, urinary retention, and risks of general anesthesia including cardiovascular, pulmonary, and thromboembolic complications.  Questions were answered.  Patient wishes to proceed with scheduling and is hopeful to have surgery completed before the end of this year. , MD

## 2020-12-30 NOTE — H&P (Signed)
     Joseph Eaton [id number]   Referring Provider:  Emeterio Reeve, MD   Subjective   Chief Complaint: hernia    History of Present Illness:    40 year old man with history of anxiety, depression, hypertension, alcoholism in recovery, and right pneumothorax presents for evaluation of a left inguinal hernia.  He has no previous abdominal surgical history. He presented to his primary care doctor about a month ago with left groin pain for the preceding 10 weeks but no palpable mass or protrusion and was found on exam to have a hernia.  Still has some discomfort, but has not noted any change otherwise. He works in [employer].   Review of Systems: A complete review of systems was obtained from the patient.  I have reviewed this information and discussed as appropriate with the patient.  See HPI as well for other ROS.   Medical History: Past Medical History:  Diagnosis Date   Anxiety    Asthma, unspecified asthma severity, unspecified whether complicated, unspecified whether persistent    GERD (gastroesophageal reflux disease)     There is no problem list on file for this patient.   History reviewed. No pertinent surgical history.   No Known Allergies  Current Outpatient Medications on File Prior to Visit  Medication Sig Dispense Refill   busPIRone (BUSPAR) 15 MG tablet 1 tablet (14.9999 mg total)     fluticasone propionate (FLONASE) 50 mcg/actuation nasal spray Place 1 spray into both nostrils once daily     traZODone (DESYREL) 100 MG tablet Take 1 tablet (100 mg total) by mouth at bedtime     No current facility-administered medications on file prior to visit.    History reviewed. No pertinent family history.   Social History   Tobacco Use  Smoking Status Never  Smokeless Tobacco Never     Social History   Socioeconomic History   Marital status: Single  Tobacco Use   Smoking status: Never   Smokeless tobacco: Never  Vaping Use   Vaping  Use: Never used  Substance and Sexual Activity   Alcohol use: Not Currently   Drug use: Not Currently   Sexual activity: Yes    Objective:    Vitals:   12/30/20 0959  BP: 110/70  Pulse: 91  Temp: 36.7 C (98.1 F)  SpO2: 98%  Weight: 86.9 kg (191 lb 9.6 oz)  Height: 180.3 cm (5\' 11" )    Body mass index is 26.72 kg/m.  Alert and well-appearing Unlabored respirations Abdomen soft and nontender.  Small left inguinal hernia with Valsalva.  No hernia on the right.  Assessment and Plan:  Diagnoses and all orders for this visit:  Inguinal hernia without obstruction or gangrene, recurrence not specified, unspecified laterality -     CCS Case Posting Request; Future   I recommend proceeding with an open repair for this unilateral nonrecurrent hernia. We discussed the relevant anatomy and we discussed the technique of the procedure.  Discussed risks of bleeding, infection, pain, scarring, injury to structures in the area including nerves, blood vessels, bowel, bladder, risk of chronic pain, hernia recurrence, risk of seroma or hematoma, urinary retention, and risks of general anesthesia including cardiovascular, pulmonary, and thromboembolic complications.  Questions were answered.  Patient wishes to proceed with scheduling and is hopeful to have surgery completed before the end of this year. , MD

## 2020-12-31 ENCOUNTER — Ambulatory Visit (HOSPITAL_COMMUNITY): Payer: BC Managed Care – PPO | Admitting: Anesthesiology

## 2020-12-31 ENCOUNTER — Encounter (HOSPITAL_COMMUNITY): Payer: Self-pay | Admitting: Surgery

## 2020-12-31 ENCOUNTER — Ambulatory Visit (HOSPITAL_COMMUNITY)
Admission: RE | Admit: 2020-12-31 | Discharge: 2020-12-31 | Disposition: A | Discharge status: Home / Self Care | Payer: BC Managed Care – PPO | Attending: Surgery | Admitting: Surgery

## 2020-12-31 ENCOUNTER — Encounter (HOSPITAL_COMMUNITY)
Admission: RE | Disposition: A | Discharge status: Home / Self Care | Payer: Self-pay | Source: Home / Self Care | Attending: Surgery

## 2020-12-31 DIAGNOSIS — I10 Essential (primary) hypertension: Secondary | ICD-10-CM | POA: Insufficient documentation

## 2020-12-31 DIAGNOSIS — K4090 Unilateral inguinal hernia, without obstruction or gangrene, not specified as recurrent: Secondary | ICD-10-CM | POA: Insufficient documentation

## 2020-12-31 DIAGNOSIS — K219 Gastro-esophageal reflux disease without esophagitis: Secondary | ICD-10-CM | POA: Insufficient documentation

## 2020-12-31 DIAGNOSIS — Z789 Other specified health status: Secondary | ICD-10-CM

## 2020-12-31 DIAGNOSIS — Z01818 Encounter for other preprocedural examination: Secondary | ICD-10-CM

## 2020-12-31 HISTORY — PX: INGUINAL HERNIA REPAIR: SHX194

## 2020-12-31 HISTORY — DX: Gastro-esophageal reflux disease without esophagitis: K21.9

## 2020-12-31 HISTORY — DX: Myopia, bilateral: H52.13

## 2020-12-31 HISTORY — DX: Alcohol dependence, in remission: F10.21

## 2020-12-31 LAB — COMPREHENSIVE METABOLIC PANEL
ALT: 30 U/L (ref 0–44)
AST: 19 U/L (ref 15–41)
Albumin: 4.9 g/dL (ref 3.5–5.0)
Alkaline Phosphatase: 46 U/L (ref 38–126)
Anion gap: 9 (ref 5–15)
BUN: 23 mg/dL — ABNORMAL HIGH (ref 6–20)
CO2: 24 mmol/L (ref 22–32)
Calcium: 9 mg/dL (ref 8.9–10.3)
Chloride: 102 mmol/L (ref 98–111)
Creatinine, Ser: 0.91 mg/dL (ref 0.61–1.24)
GFR, Estimated: >60 mL/min (ref >60)
Glucose, Bld: 93 mg/dL (ref 70–99)
Potassium: 3.9 mmol/L (ref 3.5–5.1)
Sodium: 135 mmol/L (ref 135–145)
Total Bilirubin: 1.1 mg/dL (ref 0.3–1.2)
Total Protein: 8 g/dL (ref 6.5–8.1)

## 2020-12-31 LAB — CBC
HCT: 42.9 % (ref 39.0–52.0)
Hemoglobin: 14.5 g/dL (ref 13.0–17.0)
MCH: 29.1 pg (ref 26.0–34.0)
MCHC: 33.8 g/dL (ref 30.0–36.0)
MCV: 86 fL (ref 80.0–100.0)
Platelets: 243 10*3/uL (ref 150–400)
RBC: 4.99 MIL/uL (ref 4.22–5.81)
RDW: 13.6 % (ref 11.5–15.5)
WBC: 6 10*3/uL (ref 4.0–10.5)
nRBC: 0 % (ref 0.0–0.2)

## 2020-12-31 SURGERY — REPAIR, HERNIA, INGUINAL, ADULT
Anesthesia: General | Site: Inguinal | Laterality: Left

## 2020-12-31 MED ORDER — MIDAZOLAM HCL 2 MG/2ML IJ SOLN
INTRAMUSCULAR | Status: DC | PRN
  Administered 2020-12-31: 2 mg via INTRAVENOUS

## 2020-12-31 MED ORDER — LIDOCAINE 2% (20 MG/ML) 5 ML SYRINGE
INTRAMUSCULAR | Status: DC | PRN
  Administered 2020-12-31: 40 mg via INTRAVENOUS

## 2020-12-31 MED ORDER — FENTANYL CITRATE PF 50 MCG/ML IJ SOSY
PREFILLED_SYRINGE | INTRAMUSCULAR | Status: AC
  Administered 2020-12-31: 50 ug via INTRAVENOUS
  Filled 2020-12-31: qty 2

## 2020-12-31 MED ORDER — OXYCODONE HCL 5 MG PO TABS
ORAL_TABLET | ORAL | Status: AC
  Administered 2020-12-31: 5 mg via ORAL
  Filled 2020-12-31: qty 1

## 2020-12-31 MED ORDER — DEXAMETHASONE SODIUM PHOSPHATE 10 MG/ML IJ SOLN
INTRAMUSCULAR | Status: AC
  Filled 2020-12-31: qty 1

## 2020-12-31 MED ORDER — ACETAMINOPHEN 160 MG/5ML PO SOLN: 325.0000 mg | ORAL | Status: DC | PRN

## 2020-12-31 MED ORDER — PROPOFOL 10 MG/ML IV BOLUS
INTRAVENOUS | Status: DC | PRN
  Administered 2020-12-31: 200 mg via INTRAVENOUS
  Administered 2020-12-31: 150 mg via INTRAVENOUS

## 2020-12-31 MED ORDER — SODIUM CHLORIDE 0.9 % IR SOLN
Status: DC | PRN
  Administered 2020-12-31: 1000 mL

## 2020-12-31 MED ORDER — FENTANYL CITRATE PF 50 MCG/ML IJ SOSY
PREFILLED_SYRINGE | INTRAMUSCULAR | Status: AC
  Administered 2020-12-31: 50 ug via INTRAVENOUS
  Filled 2020-12-31: qty 1

## 2020-12-31 MED ORDER — ONDANSETRON HCL 4 MG/2ML IJ SOLN
INTRAMUSCULAR | Status: AC
  Filled 2020-12-31: qty 2

## 2020-12-31 MED ORDER — FENTANYL CITRATE (PF) 250 MCG/5ML IJ SOLN
INTRAMUSCULAR | Status: DC | PRN
  Administered 2020-12-31: 25 ug via INTRAVENOUS
  Administered 2020-12-31: 50 ug via INTRAVENOUS
  Administered 2020-12-31 (×3): 25 ug via INTRAVENOUS
  Administered 2020-12-31: 50 ug via INTRAVENOUS

## 2020-12-31 MED ORDER — ACETAMINOPHEN 325 MG PO TABS: 325.0000 mg | ORAL_TABLET | ORAL | Status: DC | PRN

## 2020-12-31 MED ORDER — LACTATED RINGERS IV SOLN: INTRAVENOUS | Status: DC

## 2020-12-31 MED ORDER — BUPIVACAINE LIPOSOME 1.3 % IJ SUSP
INTRAMUSCULAR | Status: DC | PRN
  Administered 2020-12-31: 20 mL

## 2020-12-31 MED ORDER — FENTANYL CITRATE (PF) 100 MCG/2ML IJ SOLN
INTRAMUSCULAR | Status: AC
  Filled 2020-12-31: qty 2

## 2020-12-31 MED ORDER — FENTANYL CITRATE PF 50 MCG/ML IJ SOSY
25.0000 ug | PREFILLED_SYRINGE | INTRAMUSCULAR | Status: DC | PRN
  Administered 2020-12-31: 50 ug via INTRAVENOUS

## 2020-12-31 MED ORDER — PROPOFOL 10 MG/ML IV BOLUS
INTRAVENOUS | Status: AC
  Filled 2020-12-31: qty 20

## 2020-12-31 MED ORDER — OXYCODONE HCL 5 MG PO TABS: 5.0000 mg | ORAL_TABLET | Freq: Once | ORAL | Status: AC | PRN

## 2020-12-31 MED ORDER — CHLORHEXIDINE GLUCONATE 0.12 % MT SOLN
15.0000 mL | Freq: Once | OROMUCOSAL | Status: AC
  Administered 2020-12-31: 15 mL via OROMUCOSAL

## 2020-12-31 MED ORDER — ACETAMINOPHEN 325 MG PO TABS: 650.0000 mg | ORAL_TABLET | ORAL | Status: DC | PRN

## 2020-12-31 MED ORDER — DEXAMETHASONE SODIUM PHOSPHATE 10 MG/ML IJ SOLN
INTRAMUSCULAR | Status: DC | PRN
  Administered 2020-12-31: 8 mg via INTRAVENOUS

## 2020-12-31 MED ORDER — ONDANSETRON HCL 4 MG/2ML IJ SOLN
INTRAMUSCULAR | Status: DC | PRN
  Administered 2020-12-31: 4 mg via INTRAVENOUS

## 2020-12-31 MED ORDER — KETOROLAC TROMETHAMINE 30 MG/ML IJ SOLN: 30.0000 mg | Freq: Once | INTRAMUSCULAR | Status: AC | PRN

## 2020-12-31 MED ORDER — ROCURONIUM BROMIDE 10 MG/ML (PF) SYRINGE
PREFILLED_SYRINGE | INTRAVENOUS | Status: AC
  Filled 2020-12-31: qty 10

## 2020-12-31 MED ORDER — ACETAMINOPHEN 650 MG RE SUPP
650.0000 mg | RECTAL | Status: DC | PRN
  Filled 2020-12-31: qty 1

## 2020-12-31 MED ORDER — OXYCODONE HCL 5 MG PO TABS: 5.0000 mg | ORAL_TABLET | ORAL | Status: DC | PRN

## 2020-12-31 MED ORDER — OXYCODONE HCL 5 MG/5ML PO SOLN: 5.0000 mg | Freq: Once | ORAL | Status: AC | PRN

## 2020-12-31 MED ORDER — FENTANYL CITRATE PF 50 MCG/ML IJ SOSY: 25.0000 ug | PREFILLED_SYRINGE | INTRAMUSCULAR | Status: DC | PRN

## 2020-12-31 MED ORDER — CHLORHEXIDINE GLUCONATE 4 % EX LIQD: 60.0000 mL | Freq: Once | CUTANEOUS | Status: DC

## 2020-12-31 MED ORDER — ORAL CARE MOUTH RINSE: 15.0000 mL | Freq: Once | OROMUCOSAL | Status: AC

## 2020-12-31 MED ORDER — GABAPENTIN 300 MG PO CAPS
300.0000 mg | ORAL_CAPSULE | ORAL | Status: AC
  Administered 2020-12-31: 300 mg via ORAL
  Filled 2020-12-31: qty 1

## 2020-12-31 MED ORDER — DEXMEDETOMIDINE (PRECEDEX) IN NS 20 MCG/5ML (4 MCG/ML) IV SYRINGE
PREFILLED_SYRINGE | INTRAVENOUS | Status: DC | PRN
  Administered 2020-12-31 (×4): 4 ug via INTRAVENOUS

## 2020-12-31 MED ORDER — DEXMEDETOMIDINE (PRECEDEX) IN NS 20 MCG/5ML (4 MCG/ML) IV SYRINGE
PREFILLED_SYRINGE | INTRAVENOUS | Status: AC
  Filled 2020-12-31: qty 5

## 2020-12-31 MED ORDER — BUPIVACAINE LIPOSOME 1.3 % IJ SUSP: 20.0000 mL | Freq: Once | INTRAMUSCULAR | Status: DC

## 2020-12-31 MED ORDER — MIDAZOLAM HCL 2 MG/2ML IJ SOLN
INTRAMUSCULAR | Status: AC
  Filled 2020-12-31: qty 2

## 2020-12-31 MED ORDER — KETOROLAC TROMETHAMINE 30 MG/ML IJ SOLN
INTRAMUSCULAR | Status: AC
  Administered 2020-12-31: 30 mg via INTRAVENOUS
  Filled 2020-12-31: qty 1

## 2020-12-31 MED ORDER — SODIUM CHLORIDE 0.9% FLUSH: 3.0000 mL | Freq: Two times a day (BID) | INTRAVENOUS | Status: DC

## 2020-12-31 MED ORDER — CEFAZOLIN SODIUM-DEXTROSE 2-4 GM/100ML-% IV SOLN
2.0000 g | INTRAVENOUS | Status: AC
  Administered 2020-12-31: 2 g via INTRAVENOUS
  Filled 2020-12-31: qty 100

## 2020-12-31 MED ORDER — OXYCODONE HCL 5 MG PO TABS: 5.0000 mg | ORAL_TABLET | Freq: Three times a day (TID) | ORAL | 0 refills | Status: AC | PRN

## 2020-12-31 MED ORDER — BUPIVACAINE LIPOSOME 1.3 % IJ SUSP
INTRAMUSCULAR | Status: AC
  Filled 2020-12-31: qty 20

## 2020-12-31 MED ORDER — ONDANSETRON HCL 4 MG/2ML IJ SOLN: 4.0000 mg | Freq: Once | INTRAMUSCULAR | Status: DC | PRN

## 2020-12-31 MED ORDER — BUPIVACAINE-EPINEPHRINE 0.25% -1:200000 IJ SOLN
INTRAMUSCULAR | Status: AC
  Filled 2020-12-31: qty 1

## 2020-12-31 MED ORDER — ACETAMINOPHEN 500 MG PO TABS
1000.0000 mg | ORAL_TABLET | ORAL | Status: AC
  Administered 2020-12-31: 1000 mg via ORAL
  Filled 2020-12-31: qty 2

## 2020-12-31 MED ORDER — SODIUM CHLORIDE 0.9 % IV SOLN: 250.0000 mL | INTRAVENOUS | Status: DC | PRN

## 2020-12-31 MED ORDER — DOCUSATE SODIUM 100 MG PO CAPS: 100.0000 mg | ORAL_CAPSULE | Freq: Two times a day (BID) | ORAL | 0 refills | Status: AC

## 2020-12-31 MED ORDER — MEPERIDINE HCL 50 MG/ML IJ SOLN: 6.2500 mg | INTRAMUSCULAR | Status: DC | PRN

## 2020-12-31 MED ORDER — SODIUM CHLORIDE 0.9% FLUSH: 3.0000 mL | INTRAVENOUS | Status: DC | PRN

## 2020-12-31 MED ORDER — BUPIVACAINE-EPINEPHRINE 0.25% -1:200000 IJ SOLN
INTRAMUSCULAR | Status: DC | PRN
  Administered 2020-12-31: 30 mL

## 2020-12-31 SURGICAL SUPPLY — 45 items
APL PRP STRL LF DISP 70% ISPRP (MISCELLANEOUS) ×1
APL SKNCLS STERI-STRIP NONHPOA (GAUZE/BANDAGES/DRESSINGS) ×1
BAG COUNTER SPONGE SURGICOUNT (BAG) ×1 IMPLANT
BAG SPNG CNTER NS LX DISP (BAG) ×1
BENZOIN TINCTURE PRP APPL 2/3 (GAUZE/BANDAGES/DRESSINGS) ×2 IMPLANT
BLADE SURG 15 STRL LF DISP TIS (BLADE) ×1 IMPLANT
BLADE SURG 15 STRL SS (BLADE) ×2
CHLORAPREP W/TINT 26 (MISCELLANEOUS) ×2 IMPLANT
CLSR STERI-STRIP ANTIMIC 1/2X4 (GAUZE/BANDAGES/DRESSINGS) ×1 IMPLANT
COVER SURGICAL LIGHT HANDLE (MISCELLANEOUS) ×2 IMPLANT
DECANTER SPIKE VIAL GLASS SM (MISCELLANEOUS) ×1 IMPLANT
DRAIN PENROSE 0.5X18 (DRAIN) ×2 IMPLANT
DRAPE LAPAROSCOPIC ABDOMINAL (DRAPES) ×2 IMPLANT
ELECT REM PT RETURN 15FT ADLT (MISCELLANEOUS) ×2 IMPLANT
GAUZE SPONGE 4X4 12PLY STRL (GAUZE/BANDAGES/DRESSINGS) ×1 IMPLANT
GLOVE SURG ENC MOIS LTX SZ6 (GLOVE) ×2 IMPLANT
GLOVE SURG MICRO LTX SZ6 (GLOVE) ×2 IMPLANT
GLOVE SURG UNDER LTX SZ6.5 (GLOVE) ×2 IMPLANT
GOWN STRL REUS W/TWL LRG LVL3 (GOWN DISPOSABLE) ×2 IMPLANT
GOWN STRL REUS W/TWL XL LVL3 (GOWN DISPOSABLE) ×2 IMPLANT
KIT BASIN OR (CUSTOM PROCEDURE TRAY) ×2 IMPLANT
KIT TURNOVER KIT A (KITS) IMPLANT
MARKER SKIN DUAL TIP RULER LAB (MISCELLANEOUS) ×2 IMPLANT
MESH ULTRAPRO 3X6 7.6X15CM (Mesh General) ×1 IMPLANT
NEEDLE HYPO 22GX1.5 SAFETY (NEEDLE) ×2 IMPLANT
PACK BASIC VI WITH GOWN DISP (CUSTOM PROCEDURE TRAY) ×2 IMPLANT
PENCIL SMOKE EVACUATOR (MISCELLANEOUS) ×2 IMPLANT
SPONGE T-LAP 18X18 ~~LOC~~+RFID (SPONGE) ×2 IMPLANT
SPONGE T-LAP 4X18 ~~LOC~~+RFID (SPONGE) ×1 IMPLANT
STRIP CLOSURE SKIN 1/2X4 (GAUZE/BANDAGES/DRESSINGS) ×2 IMPLANT
SUT ETHIBOND 0 MO6 C/R (SUTURE) ×2 IMPLANT
SUT MNCRL AB 4-0 PS2 18 (SUTURE) ×2 IMPLANT
SUT PDS AB 0 CT1 36 (SUTURE) ×4 IMPLANT
SUT SILK 3 0 (SUTURE) ×2
SUT SILK 3-0 18XBRD TIE 12 (SUTURE) ×1 IMPLANT
SUT VIC AB 3-0 SH 27 (SUTURE) ×6
SUT VIC AB 3-0 SH 27XBRD (SUTURE) ×2 IMPLANT
SUT VICRYL 0 UR6 27IN ABS (SUTURE) IMPLANT
SUT VICRYL 3 0 BR 18  UND (SUTURE) ×2
SUT VICRYL 3 0 BR 18 UND (SUTURE) ×1 IMPLANT
SYR CONTROL 10ML LL (SYRINGE) ×2 IMPLANT
TAPE CLOTH SURG 6X10 WHT LF (GAUZE/BANDAGES/DRESSINGS) ×1 IMPLANT
TOWEL OR 17X26 10 PK STRL BLUE (TOWEL DISPOSABLE) ×2 IMPLANT
TOWEL OR NON WOVEN STRL DISP B (DISPOSABLE) ×2 IMPLANT
TRAY FOLEY MTR SLVR 16FR STAT (SET/KITS/TRAYS/PACK) IMPLANT

## 2020-12-31 NOTE — Interval H&P Note (Signed)
History and Physical Interval Note:  12/31/2020 2:13 PM  Joseph Eaton  has presented today for surgery, with the diagnosis of LEFT INGUINAL HERNIA.  The various methods of treatment have been discussed with the patient and family. After consideration of risks, benefits and other options for treatment, the patient has consented to  Procedure(s): OPEN LEFT INGUINAL HERNIA REPAIR WITH MESH (Left) as a surgical intervention.  The patient's history has been reviewed, patient examined, no change in status, stable for surgery.  I have reviewed the patient's chart and labs.  Questions were answered to the patient's satisfaction.     Mikalia Fessel Lollie Sails

## 2020-12-31 NOTE — Anesthesia Preprocedure Evaluation (Signed)
Anesthesia Evaluation  Patient identified by MRN, date of birth, ID band Patient awake    Reviewed: Allergy & Precautions, NPO status , Patient's Chart, lab work & pertinent test results  Airway Mallampati: I       Dental no notable dental hx.    Pulmonary neg pulmonary ROS,    Pulmonary exam normal        Cardiovascular hypertension, Normal cardiovascular exam     Neuro/Psych PSYCHIATRIC DISORDERS Anxiety Depression    GI/Hepatic Neg liver ROS, GERD  Medicated and Controlled,  Endo/Other  negative endocrine ROS  Renal/GU negative Renal ROS  negative genitourinary   Musculoskeletal negative musculoskeletal ROS (+)   Abdominal Normal abdominal exam  (+)   Peds  Hematology negative hematology ROS (+)   Anesthesia Other Findings   Reproductive/Obstetrics                             Anesthesia Physical Anesthesia Plan  ASA: 2  Anesthesia Plan: General   Post-op Pain Management:    Induction: Intravenous  PONV Risk Score and Plan: 4 or greater and Ondansetron, Dexamethasone and Midazolam  Airway Management Planned:   Additional Equipment: None  Intra-op Plan:   Post-operative Plan:   Informed Consent: I have reviewed the patients History and Physical, chart, labs and discussed the procedure including the risks, benefits and alternatives for the proposed anesthesia with the patient or authorized representative who has indicated his/her understanding and acceptance.     Dental advisory given  Plan Discussed with: CRNA  Anesthesia Plan Comments:         Anesthesia Quick Evaluation

## 2020-12-31 NOTE — Op Note (Signed)
Operative Note  Dewell Monnier  [id number]  [id number]  12/31/2020   Surgeon: Phylliss Blakes MD FACS   Assistant: Darnell Level MD (PGY3) I was personally present during the key and critical portions of this procedure and immediately available throughout the entire procedure, as documented in my operative note.    Procedure performed: Open inguinal hernia repair with UltraPro mesh: left direct   Preop diagnosis:  left inguinal hernia   Post-op diagnosis/intraop findings: direct inguinal hernia   Specimens: none   EBL: 5cc   Complications: none   Description of procedure: After obtaining informed consent the patient was taken to the operating room and placed supine on operating room table where general anesthesia was initiated, preoperative antibiotics were administered, SCDs applied, and a formal timeout was performed. The groin was clipped, prepped and draped in the usual sterile fashion. An oblique incision was made in the just above the inguinal ligament after infiltrating the tissues with local anesthetic (Exparel mixed with quarter percent Marcaine with epinephrine). Soft tissues were dissected using electrocautery until the external oblique aponeurosis was encountered. This was divided sharply and a plane was bluntly developed between the spermatic cord and the external oblique. The ilioinguinal nerve was identified, divided between hemostats and each and ligated with 3-0 Vicryl ties. The spermatic cord was then bluntly dissected away from the pubic tubercle and encircled with a Penrose. Inspection of the inguinal anatomy revealed a moderate direct hernia.  The spermatic cord was carefully dissected and there was no indirect sac present.  Interrupted figure-of-eight 0 PDS was used to reapproximate the inguinal floor medially maintaining reduction of the direct hernia.  A small relaxing incision was made along the rectus sheath. A 3 x 6 piece of ultra Pro mesh was brought onto the field  and trimmed to approximate the field. This was sutured to the pubic tubercle fascia using 0 ethibond. Interrupted 0 ethibonds were then used to suture the mesh to the inferior shelving edge and to the internal oblique superiorly. The tails of the mesh were wrapped around the spermatic cord, ensuring adequate room for the cord, and sutured to each other with 0 ethibond, and then directed laterally to lie flat beneath the external oblique aponeurosis. Hemostasis was ensured within the wound. The Penrose was removed. The external oblique aponeurosis was reapproximated with a running 3-0 Vicryl. More local was infiltrated around the pubic tubercle and in the plane just below the external oblique. The Scarpa's was reapproximated with interrupted 3-0 Vicryls. The skin was closed with a running subcuticular 4-0 Monocryl. The remainder of the local was injected in the subcutaneous and subcuticular space. The field was then cleaned, benzoin and Steri-Strips and sterile bandage were applied. The patient was then awakened extubated and taken to PACU in stable condition.    All counts were correct at the completion of the case

## 2020-12-31 NOTE — Discharge Instructions (Addendum)
HERNIA REPAIR: POST OP INSTRUCTIONS   EAT Gradually transition to a high fiber diet with a fiber supplement over the next few weeks after discharge.  Start with a pureed / full liquid diet (see below)  WALK Walk an hour a day (cumulative- not all at once).  Control your pain to do that.    CONTROL PAIN Control pain so that you can walk, sleep, tolerate sneezing/coughing, and go up/down stairs.  HAVE A BOWEL MOVEMENT DAILY Keep your bowels regular to avoid problems.  OK to try a laxative to override constipation.  OK to use an antidairrheal to slow down diarrhea.  Call if not better after 2 tries  CALL IF YOU HAVE PROBLEMS/CONCERNS Call if you are still struggling despite following these instructions. Call if you have concerns not answered by these instructions  ######################################################################    DIET: Follow a light bland diet & liquids the first 24 hours after arrival home, such as soup, liquids, starches, etc.  Be sure to drink plenty of fluids.  Quickly advance to a usual solid diet within a few days.  Avoid fast food or heavy meals as your are more likely to get nauseated or have irregular bowels.  A low-sugar, high-fiber diet for the rest of your life is ideal.   Take your usually prescribed home medications unless otherwise directed.  PAIN CONTROL: Pain is best controlled by a usual combination of three different methods TOGETHER: Ice/Heat Over the counter pain medication Prescription pain medication Most patients will experience some swelling and bruising around the hernia(s) such as the bellybutton, groins, or old incisions.  Ice packs or heating pads (30-60 minutes up to 6 times a day) will help. Use ice for the first few days to help decrease swelling and bruising, then switch to heat to help relax tight/sore spots and speed recovery.  Some people prefer to use ice alone, heat alone, alternating between ice & heat.  Experiment to what  works for you.  Swelling and bruising can take several weeks to resolve.   It is helpful to take an over-the-counter pain medication regularly for the first days: Naproxen (Aleve, etc)  Two 220mg tabs twice a day OR Ibuprofen (Advil, etc) Three 200mg tabs four times a day (every meal & bedtime) AND Acetaminophen (Tylenol, etc) 325-650mg four times a day (every meal & bedtime) A  prescription for pain medication should be given to you upon discharge.  Take your pain medication as prescribed, IF NEEDED.  If you are having problems/concerns with the prescription medicine (does not control pain, nausea, vomiting, rash, itching, etc), please call us [phone] to see if we need to switch you to a different pain medicine that will work better for you and/or control your side effect better. If you need a refill on your pain medication, please contact your pharmacy.  They will contact our office to request authorization. Prescriptions will not be filled after 5 pm or on week-ends.  Avoid getting constipated.  Between the surgery and the pain medications, it is common to experience some constipation.  Increasing fluid intake and taking a fiber supplement (such as Metamucil, Citrucel, FiberCon, MiraLax, etc) 1-2 times a day regularly will usually help prevent this problem from occurring.  A mild laxative (prune juice, Milk of Magnesia, MiraLax, etc) should be taken according to package directions if there are no bowel movements after 48 hours.    Wash / shower every day, starting 2 days after surgery.  You may shower over the   steri strips which are waterproof.    Remove your outer bandage 2 days after surgery.  You may leave the incision open to air.  You may replace a dressing/Band-Aid to cover an incision for comfort if you wish.  Continue to shower over incision(s) after the dressing is off.  ACTIVITIES as tolerated:   You may resume regular (light) daily activities beginning the next day--such as daily  self-care, walking, climbing stairs--gradually increasing activities as tolerated.  Control your pain so that you can walk an hour a day.  If you can walk 30 minutes without difficulty, it is safe to try more intense activity such as jogging, treadmill, bicycling, low-impact aerobics, swimming, etc. Refrain from the most intensive and strenuous activity such as sit-ups, heavy lifting, contact sports, etc  Refrain from any heavy lifting or straining until 6 weeks after surgery.   DO NOT PUSH THROUGH PAIN.  Let pain be your guide: If it hurts to do something, don't do it.  Pain is your body warning you to avoid that activity for another week until the pain goes down. You may drive when you are no longer taking prescription pain medication, you can comfortably wear a seatbelt, and you can safely maneuver your car and apply brakes. You may have sexual intercourse when it is comfortable.   FOLLOW UP in our office Please call [facility] at [phone] to set up an appointment to see your surgeon in the office for a follow-up appointment approximately 2-3 weeks after your surgery. Make sure that you call for this appointment the day you arrive home to insure a convenient appointment time.  9.  If you have disability of FMLA / Family leave forms, please bring the forms to the office for processing.  (do not give to your surgeon).  WHEN TO CALL US [phone]: Poor pain control Reactions / problems with new medications (rash/itching, nausea, etc)  Fever over 101.5 F (38.5 C) Inability to urinate Nausea and/or vomiting Worsening swelling or bruising Continued bleeding from incision. Increased pain, redness, or drainage from the incision   The clinic staff is available to answer your questions during regular business hours (8:30am-5pm).  Please don't hesitate to call and ask to speak to one of our nurses for clinical concerns.   If you have a medical emergency, go to the nearest emergency room or call  911.  A surgeon from [facility] is always on call at the hospitals in [place]  [facility] [address], [address], [place], [place]  [address] ?  [address] [address], [place], [place]   [address] MAIN: [phone] ? TOLL FREE: [phone] ? FAX: [phone] [email or web address]  

## 2020-12-31 NOTE — Transfer of Care (Signed)
Immediate Anesthesia Transfer of Care Note  Patient: Joseph Eaton  Procedure(s) Performed: OPEN LEFT INGUINAL HERNIA REPAIR WITH MESH (Left: Inguinal)  Patient Location: PACU  Anesthesia Type:General  Level of Consciousness: awake, alert  and patient cooperative  Airway & Oxygen Therapy: Patient Spontanous Breathing and Patient connected to face mask oxygen  Post-op Assessment: Report given to RN and Post -op Vital signs reviewed and stable  Post vital signs: Reviewed and stable  Last Vitals:  Vitals Value Taken Time  BP 124/78 12/31/20 1621  Temp    Pulse 83 12/31/20 1623  Resp 20 12/31/20 1623  SpO2 100 % 12/31/20 1623  Vitals shown include unvalidated device data.  Last Pain:  Vitals:   12/31/20 1330  TempSrc:   PainSc: 0-No pain         Complications: No notable events documented.

## 2020-12-31 NOTE — Progress Notes (Signed)
Cleared with Dr Hatchett/Anesthesia in regards to acetaminophen order.

## 2020-12-31 NOTE — Anesthesia Procedure Notes (Signed)
Procedure Name: LMA Insertion Date/Time: 12/31/2020 2:51 PM Performed by: Sindy Guadeloupe, CRNA Pre-anesthesia Checklist: Patient identified, Emergency Drugs available, Suction available, Patient being monitored and Timeout performed Patient Re-evaluated:Patient Re-evaluated prior to induction Oxygen Delivery Method: Circle system utilized Preoxygenation: Pre-oxygenation with 100% oxygen Induction Type: IV induction Ventilation: Mask ventilation without difficulty LMA: LMA inserted and LMA with gastric port inserted LMA Size: 4.0 Number of attempts: 1 Tube secured with: Tape Dental Injury: Teeth and Oropharynx as per pre-operative assessment

## 2021-01-03 ENCOUNTER — Encounter (HOSPITAL_COMMUNITY): Payer: Self-pay | Admitting: Surgery

## 2021-01-18 ENCOUNTER — Ambulatory Visit: Payer: BC Managed Care – PPO | Admitting: Behavioral Health

## 2021-01-20 NOTE — Anesthesia Postprocedure Evaluation (Signed)
Anesthesia Post Note  Patient: Joseph Eaton  Procedure(s) Performed: OPEN LEFT INGUINAL HERNIA REPAIR WITH MESH (Left: Inguinal)     Patient location during evaluation: PACU Anesthesia Type: General Level of consciousness: awake Pain management: pain level controlled Vital Signs Assessment: post-procedure vital signs reviewed and stable Respiratory status: spontaneous breathing Cardiovascular status: stable Postop Assessment: no apparent nausea or vomiting Anesthetic complications: no   No notable events documented.  Last Vitals:  Vitals:   12/31/20 1730 12/31/20 1745  BP: (!) 142/90 (!) 141/97  Pulse: 71 72  Resp: 16 12  Temp: 36.7 C 36.7 C  SpO2: 96% 96%    Last Pain:  Vitals:   12/31/20 1745  TempSrc:   PainSc: 2                  Caren Macadam

## 2021-02-11 ENCOUNTER — Ambulatory Visit (INDEPENDENT_AMBULATORY_CARE_PROVIDER_SITE_OTHER): Payer: BC Managed Care – PPO | Admitting: Behavioral Health

## 2021-02-11 ENCOUNTER — Encounter: Payer: Self-pay | Admitting: Behavioral Health

## 2021-02-11 ENCOUNTER — Other Ambulatory Visit: Payer: Self-pay

## 2021-02-11 DIAGNOSIS — F411 Generalized anxiety disorder: Secondary | ICD-10-CM

## 2021-02-11 DIAGNOSIS — F330 Major depressive disorder, recurrent, mild: Secondary | ICD-10-CM | POA: Diagnosis not present

## 2021-02-11 DIAGNOSIS — F1021 Alcohol dependence, in remission: Secondary | ICD-10-CM

## 2021-02-11 MED ORDER — SERTRALINE HCL 100 MG PO TABS: 250.0000 mg | ORAL_TABLET | Freq: Every day | ORAL | 3 refills | Status: DC

## 2021-02-11 MED ORDER — TRAZODONE HCL 100 MG PO TABS: 100.0000 mg | ORAL_TABLET | Freq: Every evening | ORAL | 3 refills | Status: DC | PRN

## 2021-02-11 MED ORDER — BUSPIRONE HCL 15 MG PO TABS: ORAL_TABLET | ORAL | 3 refills | Status: DC

## 2021-02-11 NOTE — Progress Notes (Signed)
[facility]  Patient ID: Joseph Eaton,  MRN: [id number]  PCP: Farris Has, MD  Date of Evaluation: 02/11/2021 Time spent:30 minutes  Chief Complaint:  Chief Complaint   Anxiety; Depression; Follow-up; Medication Refill     HISTORY/CURRENT STATUS: HPI 41 year old patient presents to this office for follow up and medication management. He says that he is doing well and his anxiety has been controlled. He says there has been a few situations on while on family vacation he experienced increased anxiety from work stress. He still has been maintaining sobriety. Says everything has been going well in his life. 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 Says that family life has been improving. SSays he still is active in AA weekly and Bible study.  Reports anxiety 4/10, depression 0/10. Sleeping 7-8 hours per evening. No mania, psychosis. No SI/HI.     Prior psychiatric medication trials: Abilify Inderal Prozac Effexor      Individual Medical History/ Review of Systems: Changes? :No   Allergies: Patient has no known allergies.  Current Medications:  Current Outpatient Medications:    busPIRone (BUSPAR) 15 MG tablet, Take one tablet twice daily. (Patient taking differently: Take 15 mg by mouth 2 (two) times daily. Take one tablet twice daily.), Disp: 60 tablet, Rfl: 3   fluticasone (FLONASE) 50 MCG/ACT nasal spray, Place 1 spray into both nostrils daily as needed for allergies., Disp: , Rfl:    omeprazole (PRILOSEC) 40 MG capsule, Take 40 mg by mouth 2 (two) times daily., Disp: , Rfl:    sertraline (ZOLOFT) 100 MG tablet, Take 1 tablet (100 mg total) by mouth daily. (Patient taking differently: Take 250 mg by mouth daily. Taking 2 1/2 tabs daily.), Disp: 45 tablet, Rfl: 3   traZODone (DESYREL) 100 MG tablet, Take 100 mg by mouth at bedtime as needed for sleep., Disp: , Rfl:  Medication Side Effects: none  Family Medical/ Social  History: Changes? No  MENTAL HEALTH EXAM:  There were no vitals taken for this visit.There is no height or weight on file to calculate BMI.  General Appearance: Casual and Neat  Eye Contact:  Good  Speech:  Clear and Coherent  Volume:  Normal  Mood:  Anxious  Affect:  Appropriate  Thought Process:  Coherent  Orientation:  Full (Time, Place, and Person)  Thought Content: Logical   Suicidal Thoughts:  No  Homicidal Thoughts:  No  Memory:  WNL  Judgement:  Good  Insight:  Good  Psychomotor Activity:  Normal  Concentration:  Concentration: Good  Recall:  Good  Fund of Knowledge: Good  Language: Good  Assets:  Desire for Improvement  ADL's:  Intact  Cognition: WNL  Prognosis:  Good    DIAGNOSES:    ICD-10-CM   1. Generalized anxiety disorder  F41.1     2. Mild episode of recurrent major depressive disorder (HCC)  F33.0     3. 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 Greater than 50% of 30 min.face to face time with patient was spent on counseling and coordination of care. Discussed overall improvement with  anxiety. Discussed work dynamics and coping with stressors on the job. He is still participating in AA regularly and currently continues in remission.  Discussed good sleep hygiene and smart nutrition.       Joan Flores, NP

## 2021-02-15 ENCOUNTER — Telehealth: Payer: Self-pay | Admitting: Behavioral Health

## 2021-02-15 ENCOUNTER — Other Ambulatory Visit: Payer: Self-pay | Admitting: Behavioral Health

## 2021-02-15 DIAGNOSIS — F3341 Major depressive disorder, recurrent, in partial remission: Secondary | ICD-10-CM

## 2021-02-15 DIAGNOSIS — F330 Major depressive disorder, recurrent, mild: Secondary | ICD-10-CM

## 2021-02-15 DIAGNOSIS — F411 Generalized anxiety disorder: Secondary | ICD-10-CM

## 2021-02-15 DIAGNOSIS — F1010 Alcohol abuse, uncomplicated: Secondary | ICD-10-CM

## 2021-02-15 MED ORDER — SERTRALINE HCL 50 MG PO TABS: 50.0000 mg | ORAL_TABLET | Freq: Every day | ORAL | 3 refills | Status: DC

## 2021-02-15 MED ORDER — SERTRALINE HCL 100 MG PO TABS: 100.0000 mg | ORAL_TABLET | Freq: Every day | ORAL | 3 refills | Status: DC

## 2021-02-15 NOTE — Telephone Encounter (Signed)
Sent to pharmacy. No further action needed.

## 2021-02-15 NOTE — Telephone Encounter (Signed)
Please review

## 2021-02-15 NOTE — Telephone Encounter (Signed)
Patient lvm concerning prescription for Zoloft. States that he spoke with insurance and they won't cover it the way prescription is written. Needs new prescription for 2 100mg  and 1 50mg  and that should work. Pls call [phone]

## 2021-02-16 ENCOUNTER — Other Ambulatory Visit: Payer: Self-pay | Admitting: Behavioral Health

## 2021-02-16 ENCOUNTER — Telehealth: Payer: Self-pay | Admitting: Behavioral Health

## 2021-02-16 DIAGNOSIS — F3341 Major depressive disorder, recurrent, in partial remission: Secondary | ICD-10-CM

## 2021-02-16 DIAGNOSIS — F411 Generalized anxiety disorder: Secondary | ICD-10-CM

## 2021-02-16 DIAGNOSIS — F330 Major depressive disorder, recurrent, mild: Secondary | ICD-10-CM

## 2021-02-16 MED ORDER — SERTRALINE HCL 100 MG PO TABS: 200.0000 mg | ORAL_TABLET | Freq: Every day | ORAL | 3 refills | Status: DC

## 2021-02-16 NOTE — Telephone Encounter (Signed)
Rx sent yesterday for sertraline 100 mg should be take two tablets and was sent as one.

## 2021-02-16 NOTE — Telephone Encounter (Signed)
I corrected. Thanks

## 2021-02-16 NOTE — Telephone Encounter (Signed)
Next appt 4/13

## 2021-02-16 NOTE — Telephone Encounter (Signed)
Joseph Eaton at [employer] LVM stating he had a question about the pt's prescription (didn't say which one).  Pls call back at [phone]

## 2021-02-16 NOTE — Telephone Encounter (Signed)
Joseph Eaton

## 2021-05-12 ENCOUNTER — Ambulatory Visit (INDEPENDENT_AMBULATORY_CARE_PROVIDER_SITE_OTHER): Payer: BC Managed Care – PPO | Admitting: Behavioral Health

## 2021-05-12 ENCOUNTER — Encounter: Payer: Self-pay | Admitting: Behavioral Health

## 2021-05-12 DIAGNOSIS — F3341 Major depressive disorder, recurrent, in partial remission: Secondary | ICD-10-CM

## 2021-05-12 DIAGNOSIS — F411 Generalized anxiety disorder: Secondary | ICD-10-CM

## 2021-05-12 NOTE — Progress Notes (Signed)
[facility] ? ?Patient ID: Joseph Eaton,  ?MRN: [id number] ? ?PCP: Farris Has, MD ? ?Date of Evaluation: 05/12/2021 ?Time spent:20 minutes ? ?Chief Complaint:  ?Chief Complaint   ?Anxiety; Depression; Follow-up; Medication Refill; Fatigue ?  ? ? ?HISTORY/CURRENT STATUS: ?HPI ? ?41 year old patient presents to this office for follow up and medication management. Says that he is still doing pretty well. He is a little concerned about his lack of energy and motivation. Says that after he gets off work, he just wants to "veg out" on the couch and not do anything else. He cannot identify any triggers or changes that could be responsible. 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 Says that family life has been improving. SSays he still is active in AA weekly and Bible study.  Reports anxiety 3/10, depression 2/10. Sleeping 7-8 hours per evening. No mania, psychosis. No SI/HI. ?  ?  ?Prior psychiatric medication trials: ?Abilify ?Inderal ?Prozac ?Effexor ? ?Individual Medical History/ Review of Systems: Changes? :No  ? ?Allergies: Patient has no known allergies. ? ?Current Medications:  ?Current Outpatient Medications:  ?  busPIRone (BUSPAR) 15 MG tablet, Take one tablet twice daily., Disp: 60 tablet, Rfl: 3 ?  fluticasone (FLONASE) 50 MCG/ACT nasal spray, Place 1 spray into both nostrils daily as needed for allergies., Disp: , Rfl:  ?  omeprazole (PRILOSEC) 40 MG capsule, Take 40 mg by mouth 2 (two) times daily., Disp: , Rfl:  ?  sertraline (ZOLOFT) 100 MG tablet, Take 2 tablets (200 mg total) by mouth daily., Disp: 60 tablet, Rfl: 3 ?  sertraline (ZOLOFT) 50 MG tablet, Take 1 tablet (50 mg total) by mouth daily., Disp: 30 tablet, Rfl: 3 ?  traZODone (DESYREL) 100 MG tablet, Take 1 tablet (100 mg total) by mouth at bedtime as needed for sleep., Disp: 30 tablet, Rfl: 3 ?Medication Side Effects: none ? ?Family Medical/ Social History: Changes? No ? ?MENTAL HEALTH  EXAM: ? ?There were no vitals taken for this visit.There is no height or weight on file to calculate BMI.  ?General Appearance: Casual, Neat, and Well Groomed  ?Eye Contact:  Good  ?Speech:  Clear and Coherent  ?Volume:  Normal  ?Mood:  NA  ?Affect:  Appropriate  ?Thought Process:  Coherent  ?Orientation:  Full (Time, Place, and Person)  ?Thought Content: Logical   ?Suicidal Thoughts:  No  ?Homicidal Thoughts:  No  ?Memory:  WNL  ?Judgement:  Good  ?Insight:  Good  ?Psychomotor Activity:  Normal  ?Concentration:  Concentration: Good  ?Recall:  Good  ?Fund of Knowledge: Good  ?Language: Good  ?Assets:  Desire for Improvement  ?ADL's:  Intact  ?Cognition: WNL  ?Prognosis:  Good  ? ? ?DIAGNOSES: No diagnosis found. ? ?Receiving Psychotherapy: No ? ? ?RECOMMENDATIONS:  ? ?Continue Zoloft  250 mg daily  ? Continue Buspar 15 mg twice daily for anxiety ?Continue Trazodone 100 mg daily at bedtime for sleep ?Will report and new side effects or worsening symptoms ?Provided after hours emergency contact information ?To follow up in 3 months to reassess. ?Greater than 50% of 30 min.face to face time with patient was spent on counseling and coordination of care. He is a little concerned about his energy levels and lack of motivation this visit. 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 Discussed overall improvement with anxiety. Discussed work dynamics and coping with stressors on the job. He is still participating in  AA regularly and currently continues in remission.  Discussed good sleep hygiene and smart nutrition.  ? ? ? ? ?Joan Flores, NP  ?

## 2021-06-19 ENCOUNTER — Other Ambulatory Visit: Payer: Self-pay | Admitting: Behavioral Health

## 2021-06-19 DIAGNOSIS — F3341 Major depressive disorder, recurrent, in partial remission: Secondary | ICD-10-CM

## 2021-06-19 DIAGNOSIS — F330 Major depressive disorder, recurrent, mild: Secondary | ICD-10-CM

## 2021-06-19 DIAGNOSIS — F411 Generalized anxiety disorder: Secondary | ICD-10-CM

## 2021-06-25 ENCOUNTER — Other Ambulatory Visit: Payer: Self-pay | Admitting: Behavioral Health

## 2021-06-25 DIAGNOSIS — F411 Generalized anxiety disorder: Secondary | ICD-10-CM

## 2021-08-11 ENCOUNTER — Encounter: Payer: Self-pay | Admitting: Behavioral Health

## 2021-08-11 ENCOUNTER — Ambulatory Visit (INDEPENDENT_AMBULATORY_CARE_PROVIDER_SITE_OTHER): Payer: BC Managed Care – PPO | Admitting: Behavioral Health

## 2021-08-11 DIAGNOSIS — F411 Generalized anxiety disorder: Secondary | ICD-10-CM

## 2021-08-11 DIAGNOSIS — F330 Major depressive disorder, recurrent, mild: Secondary | ICD-10-CM

## 2021-08-11 DIAGNOSIS — F3341 Major depressive disorder, recurrent, in partial remission: Secondary | ICD-10-CM | POA: Diagnosis not present

## 2021-08-11 MED ORDER — SERTRALINE HCL 50 MG PO TABS: ORAL_TABLET | ORAL | 3 refills | Status: DC

## 2021-08-11 MED ORDER — BUSPIRONE HCL 15 MG PO TABS: ORAL_TABLET | ORAL | 3 refills | Status: DC

## 2021-08-11 MED ORDER — SERTRALINE HCL 100 MG PO TABS: ORAL_TABLET | ORAL | 3 refills | Status: DC

## 2021-08-11 MED ORDER — TRAZODONE HCL 100 MG PO TABS: ORAL_TABLET | ORAL | 3 refills | Status: DC

## 2021-08-11 MED ORDER — HYDROXYZINE HCL 10 MG PO TABS: 10.0000 mg | ORAL_TABLET | Freq: Three times a day (TID) | ORAL | 1 refills | Status: DC | PRN

## 2021-08-11 NOTE — Progress Notes (Signed)
[facility]  Patient ID: Joseph Eaton,  MRN: [id number]  PCP: Farris Has, MD  Date of Evaluation: 08/11/2021 Time spent:30 minutes  Chief Complaint:  Chief Complaint   Anxiety; Depression; Follow-up; Medication Refill; Medication Problem     HISTORY/CURRENT STATUS: HPI  41 year old patient presents to this office for follow up and medication management. Says that he is still doing pretty well overall. He is starting to experience a little increase in anxiety and says that he has had a couple of panic attacks since last visit. Says that it usually happens in the am before work. Once even happed while he was taking a shower. Says his new position is going very well and he feels accomplished but sometimes he feels a sense of dread getting up and going in. He loves his job but does not understand why. He is requesting medication that could help with the intermittent increase anxiety.  Says that family life has been improving. Says he still is active in AA weekly and Bible study.  Reports anxiety 3/10, depression 2/10. Sleeping 7-8 hours per evening. No mania, psychosis. No SI/HI.     Prior psychiatric medication trials: Abilify Inderal Prozac Effexor     Individual Medical History/ Review of Systems: Changes? :No   Allergies: Patient has no known allergies.  Current Medications:  Current Outpatient Medications:    hydrOXYzine (ATARAX) 10 MG tablet, Take 1 tablet (10 mg total) by mouth 3 (three) times daily as needed., Disp: 30 tablet, Rfl: 1   busPIRone (BUSPAR) 15 MG tablet, Take one tablet three times daily at least 7-8 hours between doses., Disp: 90 tablet, Rfl: 3   fluticasone (FLONASE) 50 MCG/ACT nasal spray, Place 1 spray into both nostrils daily as needed for allergies., Disp: , Rfl:    omeprazole (PRILOSEC) 40 MG capsule, Take 40 mg by mouth 2 (two) times daily., Disp: , Rfl:    sertraline (ZOLOFT) 100 MG tablet, TAKE 2 TABLETS(200 MG) BY MOUTH DAILY,  Disp: 60 tablet, Rfl: 3   sertraline (ZOLOFT) 50 MG tablet, TAKE 1 TABLET(50 MG) BY MOUTH DAILY, Disp: 30 tablet, Rfl: 3   traZODone (DESYREL) 100 MG tablet, TAKE 1 TABLET(100 MG) BY MOUTH AT BEDTIME AS NEEDED FOR SLEEP, Disp: 30 tablet, Rfl: 3 Medication Side Effects: none  Family Medical/ Social History: Changes? No  MENTAL HEALTH EXAM:  There were no vitals taken for this visit.There is no height or weight on file to calculate BMI.  General Appearance: Casual, Neat, and Well Groomed  Eye Contact:  Good  Speech:  Clear and Coherent  Volume:  Normal  Mood:  Anxious  Affect:  Congruent and Anxious  Thought Process:  Coherent  Orientation:  Full (Time, Place, and Person)  Thought Content: Logical   Suicidal Thoughts:  No  Homicidal Thoughts:  No  Memory:  WNL  Judgement:  Good  Insight:  Good  Psychomotor Activity:  Normal  Concentration:  Concentration: Good  Recall:  Good  Fund of Knowledge: Good  Language: Good  Assets:  Desire for Improvement  ADL's:  Intact  Cognition: WNL  Prognosis:  Good    DIAGNOSES:    ICD-10-CM   1. Generalized anxiety disorder  F41.1 busPIRone (BUSPAR) 15 MG tablet    sertraline (ZOLOFT) 100 MG tablet    sertraline (ZOLOFT) 50 MG tablet    traZODone (DESYREL) 100 MG tablet    hydrOXYzine (ATARAX) 10 MG tablet    2. Mild episode of recurrent major depressive disorder (  HCC)  F33.0 sertraline (ZOLOFT) 100 MG tablet    sertraline (ZOLOFT) 50 MG tablet    3. Recurrent major depressive disorder, in partial remission (HCC)  F33.41 sertraline (ZOLOFT) 100 MG tablet    sertraline (ZOLOFT) 50 MG tablet      Receiving Psychotherapy: No    RECOMMENDATIONS:   Continue Zoloft  250 mg daily   Continue Buspar 15 mg three times daily for anxiety Continue Trazodone 100 mg daily at bedtime for sleep To start hydroxyzine 10 mg three times daily as needed for increased anxiety.  Will report and new side effects or worsening symptoms Provided after  hours emergency contact information To follow up in 4 weeks to reassess. Greater than 50% of 30 min.face to face time with patient was spent on counseling and coordination of care. He is still concerned about increased anxiety and panic attacks. My opinion is that it is predominately job related and mostly occurs in the morning before he goes to work. He is still participating in AA regularly and currently continues in remission.  Discussed good sleep hygiene and smart nutrition.        Joan Flores, NP

## 2021-09-08 ENCOUNTER — Ambulatory Visit (INDEPENDENT_AMBULATORY_CARE_PROVIDER_SITE_OTHER): Payer: BC Managed Care – PPO | Admitting: Behavioral Health

## 2021-09-08 ENCOUNTER — Encounter: Payer: Self-pay | Admitting: Behavioral Health

## 2021-09-08 DIAGNOSIS — F411 Generalized anxiety disorder: Secondary | ICD-10-CM | POA: Diagnosis not present

## 2021-09-08 MED ORDER — HYDROXYZINE HCL 10 MG PO TABS: 10.0000 mg | ORAL_TABLET | Freq: Three times a day (TID) | ORAL | 3 refills | Status: DC | PRN

## 2021-09-08 NOTE — Progress Notes (Signed)
[facility]  Patient ID: Joseph Eaton,  MRN: [id number]  PCP: Farris Has, MD  Date of Evaluation: 09/08/2021 Time spent:20 minutes  Chief Complaint:  Chief Complaint   Anxiety; Follow-up; Medication Refill; Patient Education     HISTORY/CURRENT STATUS: HPI  41 year old patient presents to this office for follow up and medication management. Says that he is still doing pretty well overall. Says hydroxyzine is really helping especially with that increased anxiety he experiences in the morning before work.  Says that his wife has seen the difference. He request no changes this visit and would like to continue his current medication regimen. He is requesting medication that could help with the intermittent increase anxiety.  Says that family life has been improving. Says he still is active in AA weekly and Bible study.  Reports anxiety 2/10, depression 1/10. Sleeping 7-8 hours per evening. No mania, psychosis. No SI/HI.     Prior psychiatric medication trials: Abilify Inderal Prozac Effexor  Individual Medical History/ Review of Systems: Changes? :No   Allergies: Patient has no known allergies.  Current Medications:  Current Outpatient Medications:    busPIRone (BUSPAR) 15 MG tablet, Take one tablet three times daily at least 7-8 hours between doses., Disp: 90 tablet, Rfl: 3   fluticasone (FLONASE) 50 MCG/ACT nasal spray, Place 1 spray into both nostrils daily as needed for allergies., Disp: , Rfl:    hydrOXYzine (ATARAX) 10 MG tablet, Take 1 tablet (10 mg total) by mouth 3 (three) times daily as needed., Disp: 30 tablet, Rfl: 3   omeprazole (PRILOSEC) 40 MG capsule, Take 40 mg by mouth 2 (two) times daily., Disp: , Rfl:    sertraline (ZOLOFT) 100 MG tablet, TAKE 2 TABLETS(200 MG) BY MOUTH DAILY, Disp: 60 tablet, Rfl: 3   sertraline (ZOLOFT) 50 MG tablet, TAKE 1 TABLET(50 MG) BY MOUTH DAILY, Disp: 30 tablet, Rfl: 3   traZODone (DESYREL) 100 MG tablet, TAKE 1  TABLET(100 MG) BY MOUTH AT BEDTIME AS NEEDED FOR SLEEP, Disp: 30 tablet, Rfl: 3 Medication Side Effects: none  Family Medical/ Social History: Changes? No  MENTAL HEALTH EXAM:  There were no vitals taken for this visit.There is no height or weight on file to calculate BMI.  General Appearance: Casual, Neat, and Well Groomed  Eye Contact:  Good  Speech:  Clear and Coherent  Volume:  Normal  Mood:  Anxious  Affect:  Appropriate and Constricted  Thought Process:  Coherent  Orientation:  Full (Time, Place, and Person)  Thought Content: Logical   Suicidal Thoughts:  No  Homicidal Thoughts:  No  Memory:  WNL  Judgement:  Good  Insight:  Good  Psychomotor Activity:  Normal  Concentration:  Concentration: Good  Recall:  Good  Fund of Knowledge: Good  Language: Good  Assets:  Desire for Improvement  ADL's:  Intact  Cognition: WNL  Prognosis:  Good    DIAGNOSES:    ICD-10-CM   1. Generalized anxiety disorder  F41.1 hydrOXYzine (ATARAX) 10 MG tablet      Receiving Psychotherapy: No    RECOMMENDATIONS  Continue Zoloft  250 mg daily   Continue Buspar 15 mg three times daily for anxiety Continue Trazodone 100 mg daily at bedtime for sleep Continue hydroxyzine 10 mg three times daily as needed for increased anxiety.  Will report and new side effects or worsening symptoms Provided after hours emergency contact information To follow up in 4 months to reassess. Greater than 50% of 30 min.face to face  time with patient was spent on counseling and coordination of care. He is very happy with the hydroxyzine. Feels like it is help with anxiety especially in the morning.  My opinion is that it is predominately job related and mostly occurs in the morning before he goes to work. He is still participating in AA regularly and currently continues in remission.  Discussed good sleep hygiene and smart nutrition.           Joan Flores, NP

## 2021-10-21 DIAGNOSIS — F418 Other specified anxiety disorders: Secondary | ICD-10-CM | POA: Diagnosis not present

## 2021-10-21 DIAGNOSIS — K219 Gastro-esophageal reflux disease without esophagitis: Secondary | ICD-10-CM | POA: Diagnosis not present

## 2021-10-21 DIAGNOSIS — J309 Allergic rhinitis, unspecified: Secondary | ICD-10-CM | POA: Diagnosis not present

## 2021-10-21 DIAGNOSIS — G479 Sleep disorder, unspecified: Secondary | ICD-10-CM | POA: Diagnosis not present

## 2021-11-04 DIAGNOSIS — F418 Other specified anxiety disorders: Secondary | ICD-10-CM | POA: Diagnosis not present

## 2021-11-04 DIAGNOSIS — G4719 Other hypersomnia: Secondary | ICD-10-CM | POA: Diagnosis not present

## 2021-11-04 DIAGNOSIS — F5104 Psychophysiologic insomnia: Secondary | ICD-10-CM | POA: Diagnosis not present

## 2021-11-18 ENCOUNTER — Telehealth: Payer: Self-pay

## 2021-11-18 ENCOUNTER — Ambulatory Visit (INDEPENDENT_AMBULATORY_CARE_PROVIDER_SITE_OTHER): Payer: BC Managed Care – PPO | Admitting: Internal Medicine

## 2021-11-18 ENCOUNTER — Encounter: Payer: Self-pay | Admitting: Internal Medicine

## 2021-11-18 ENCOUNTER — Other Ambulatory Visit: Payer: Self-pay

## 2021-11-18 VITALS — BP 126/80 | HR 106 | Temp 98.4°F | Resp 18 | Ht 71.0 in | Wt 179.0 lb

## 2021-11-18 DIAGNOSIS — H1013 Acute atopic conjunctivitis, bilateral: Secondary | ICD-10-CM | POA: Diagnosis not present

## 2021-11-18 DIAGNOSIS — J3089 Other allergic rhinitis: Secondary | ICD-10-CM

## 2021-11-18 DIAGNOSIS — J330 Polyp of nasal cavity: Secondary | ICD-10-CM | POA: Diagnosis not present

## 2021-11-18 DIAGNOSIS — H1045 Other chronic allergic conjunctivitis: Secondary | ICD-10-CM

## 2021-11-18 MED ORDER — EPINEPHRINE 0.3 MG/0.3ML IJ SOAJ: 0.3000 mg | INTRAMUSCULAR | 1 refills | Status: DC | PRN

## 2021-11-18 MED ORDER — XHANCE 93 MCG/ACT NA EXHU: 1.0000 | INHALANT_SUSPENSION | Freq: Two times a day (BID) | NASAL | 6 refills | Status: DC

## 2021-11-18 MED ORDER — XHANCE 93 MCG/ACT NA EXHU: 1.0000 | INHALANT_SUSPENSION | Freq: Two times a day (BID) | NASAL | 6 refills | Status: AC

## 2021-11-18 NOTE — Patient Instructions (Addendum)
Seasonal and perennial  Rhinitis: not well controlled  - Testing today showed positive to grass, trees, ragweed. Dust mite and cat - Intradermal testing  positive to dog and mold  - Copy of test results provided.  - Avoidance measures provided. - Continue with: Zyrtec (cetirizine) 10mg  tablet once daily - Start taking: Xhance (fluticasone) 1-2 sprays per nostril twice daily and Pataday (olopatadine) one drop per eye twice daily as needed - You can use an extra dose of the antihistamine, if needed, for breakthrough symptoms.  - Consider nasal saline rinses 1-2 times daily to remove allergens from the nasal cavities as well as help with mucous clearance (this is especially helpful to do before the nasal sprays are given) Start allergy injections. Had a detailed discussion with patient/family that clinical history is suggestive of allergic rhinitis, and may benefit from allergy immunotherapy (AIT). Discussed in detail regarding the dosing, schedule, side effects (mild to moderate local allergic reaction and rarely systemic allergic reactions including anaphylaxis/death), alternatives and benefits (significant improvement in nasal symptoms, seasonal flares of asthma) of immunotherapy with the patient. There is significant time commitment involved with allergy shots, which includes weekly immunotherapy injections for first 9-12 months and then biweekly to monthly injections for 3-5 years. Clinical response is often delayed and patient may not see an improvement for 6-12 months. Consent was signed. I have prescribed epinephrine injectable and demonstrated proper use. For mild symptoms you can take over the counter antihistamines such as Benadryl and monitor symptoms closely. If symptoms worsen or if you have severe symptoms including breathing issues, throat closure, significant swelling, whole body hives, severe diarrhea and vomiting, lightheadedness then inject epinephrine and seek immediate medical care  afterwards. Action plan given.  We will have our [place] staff contact you for your [facility] desenstization appointment.   Follow up: 6 months in clinic   Thank you so much for letting me partake in your care today.  Don't hesitate to reach out if you have any additional concerns!  Roney Marion, MD  [facility] [place] [place]  Reducing Pollen Exposure  The American Academy of Allergy, Asthma and Immunology suggests the following steps to reduce your exposure to pollen during allergy seasons.    Do not hang sheets or clothing out to dry; pollen may collect on these items. Do not mow lawns or spend time around freshly cut grass; mowing stirs up pollen. Keep windows closed at night.  Keep car windows closed while driving. Minimize morning activities outdoors, a time when pollen counts are usually at their highest. Stay indoors as much as possible when pollen counts or humidity is high and on windy days when pollen tends to remain in the air longer. Use air conditioning when possible.  Many air conditioners have filters that trap the pollen spores. Use a HEPA room air filter to remove pollen form the indoor air you breathe.  Control of Dog or Cat Allergen  Avoidance is the best way to manage a dog or cat allergy. If you have a dog or cat and are allergic to dog or cats, consider removing the dog or cat from the home. If you have a dog or cat but don't want to find it a new home, or if your family wants a pet even though someone in the household is allergic, here are some strategies that may help keep symptoms at bay:  Keep the pet out of your bedroom and restrict it to only a few rooms. Be advised  that keeping the dog or cat in only one room will not limit the allergens to that room. Don't pet, hug or kiss the dog or cat; if you do, wash your hands with soap and water. High-efficiency particulate air (HEPA) cleaners run continuously in a bedroom or living room can reduce  allergen levels over time. Regular use of a high-efficiency vacuum cleaner or a central vacuum can reduce allergen levels. Giving your dog or cat a bath at least once a week can reduce airborne allergen.  DUST MITE AVOIDANCE MEASURES:  There are three main measures that need and can be taken to avoid house dust mites:  Reduce accumulation of dust in general -reduce furniture, clothing, carpeting, books, stuffed animals, especially in bedroom  Separate yourself from the dust -use pillow and mattress encasements (can be found at stores such as Bed, [employer], and Beyond or online) -avoid direct exposure to air condition flow -use a HEPA filter device, especially in the bedroom; you can also use a HEPA filter vacuum cleaner -wipe dust with a moist towel instead of a dry towel or broom when cleaning  Decrease mites and/or their secretions -wash clothing and linen and stuffed animals at highest temperature possible, at least every 2 weeks -stuffed animals can also be placed in a bag and put in a freezer overnight  Despite the above measures, it is impossible to eliminate dust mites or their allergen completely from your home.  With the above measures the burden of mites in your home can be diminished, with the goal of minimizing your allergic symptoms.  Success will be reached only when implementing and using all means together.  Control of Mold Allergen   Mold and fungi can grow on a variety of surfaces provided certain temperature and moisture conditions exist.  Outdoor molds grow on plants, decaying vegetation and soil.  The major outdoor mold, Alternaria and Cladosporium, are found in very high numbers during hot and dry conditions.  Generally, a late Summer - Fall peak is seen for common outdoor fungal spores.  Rain will temporarily lower outdoor mold spore count, but counts rise rapidly when the rainy period ends.  The most important indoor molds are Aspergillus and Penicillium.  Dark, humid and  poorly ventilated basements are ideal sites for mold growth.  The next most common sites of mold growth are the bathroom and the kitchen.  Outdoor (Seasonal) Mold Control  Positive outdoor molds via skin testing: Alternaria, Cladosporium, Bipolaris (Helminthsporium), Drechslera (Curvalaria), and Mucor  Use air conditioning and keep windows closed Avoid exposure to decaying vegetation. Avoid leaf raking. Avoid grain handling. Consider wearing a face mask if working in moldy areas.    Indoor (Perennial) Mold Control   Positive indoor molds via skin testing: Aspergillus and Penicillium  Maintain humidity below 50%. Clean washable surfaces with 5% bleach solution. Remove sources e.g. contaminated carpets.

## 2021-11-18 NOTE — Progress Notes (Signed)
New Patient Note  RE: Joseph Eaton MRN: [id number] DOB: 1980/10/16 Date of Office Visit: 11/18/2021  Consult requested by: Farris Has, MD Primary care provider: Farris Has, MD  Chief Complaint: Allergic Rhinitis  (Has not been able to smell for 10 years - was seen 10 years ago and has enlarged polyps that are allergy related. Now having stringy mucus around his eyes in the morning and dry eyes )  History of Present Illness: I had the pleasure of seeing Takao Lizer for initial evaluation at the [facility] on 11/18/2021. He is a 41 y.o. male, who is referred here by Farris Has, MD for the evaluation of allergic rhinitis and nasal polyps.  History obtained from patient, chart review.  Chronic rhinitis: started 10 years ago Symptoms include:  anosmia and ageusia, nasal congestion, rhinorrhea, post nasal drainage, sneezing, watery eyes, itchy eyes, and itchy nose  Occurs year-round Potential triggers: seasonal changes  Treatments tried: zyrtec, flonase, prednisone  Previous allergy testing: yes History of reflux/heartburn: yes on omeprazole with good control  History of chronic sinusitis or sinus surgery:  denies Nonallergic triggers:  denies      Assessment and Plan: Shon is a 41 y.o. male with: Other allergic rhinitis - Plan: Allergy Test, Interdermal Allergy Test, Allergen Immunotherapy, Allergen Immunotherapy  Other chronic allergic conjunctivitis of both eyes - Plan: Allergy Test, Interdermal Allergy Test, Allergen Immunotherapy, Allergen Immunotherapy  Polyp of nasal cavity - Plan: Allergy Test, Interdermal Allergy Test, Allergen Immunotherapy, Allergen Immunotherapy Plan: Patient Instructions  Seasonal and perennial  Rhinitis: not well controlled  - Testing today showed positive to grass, trees, ragweed. Dust mite and cat - Intradermal testing  positive to dog and mold  - Copy of test results provided.  - Avoidance measures  provided. - Continue with: Zyrtec (cetirizine) 10mg  tablet once daily - Start taking: Xhance (fluticasone) 1-2 sprays per nostril twice daily and Pataday (olopatadine) one drop per eye twice daily as needed - You can use an extra dose of the antihistamine, if needed, for breakthrough symptoms.  - Consider nasal saline rinses 1-2 times daily to remove allergens from the nasal cavities as well as help with mucous clearance (this is especially helpful to do before the nasal sprays are given) Start allergy injections. Had a detailed discussion with patient/family that clinical history is suggestive of allergic rhinitis, and may benefit from allergy immunotherapy (AIT). Discussed in detail regarding the dosing, schedule, side effects (mild to moderate local allergic reaction and rarely systemic allergic reactions including anaphylaxis/death), alternatives and benefits (significant improvement in nasal symptoms, seasonal flares of asthma) of immunotherapy with the patient. There is significant time commitment involved with allergy shots, which includes weekly immunotherapy injections for first 9-12 months and then biweekly to monthly injections for 3-5 years. Clinical response is often delayed and patient may not see an improvement for 6-12 months. Consent was signed. I have prescribed epinephrine injectable and demonstrated proper use. For mild symptoms you can take over the counter antihistamines such as Benadryl and monitor symptoms closely. If symptoms worsen or if you have severe symptoms including breathing issues, throat closure, significant swelling, whole body hives, severe diarrhea and vomiting, lightheadedness then inject epinephrine and seek immediate medical care afterwards. Action plan given.  We will have our [facility] staff contact you for your [facility] desenstization appointment.   Follow up: 6 months in clinic   Thank you so much for letting me partake in your care today.  Don't hesitate to  reach out  if you have any additional concerns!  Ferol Luz, MD  [facility] [place] [place]  Reducing Pollen Exposure  The American Academy of Allergy, Asthma and Immunology suggests the following steps to reduce your exposure to pollen during allergy seasons.    Do not hang sheets or clothing out to dry; pollen may collect on these items. Do not mow lawns or spend time around freshly cut grass; mowing stirs up pollen. Keep windows closed at night.  Keep car windows closed while driving. Minimize morning activities outdoors, a time when pollen counts are usually at their highest. Stay indoors as much as possible when pollen counts or humidity is high and on windy days when pollen tends to remain in the air longer. Use air conditioning when possible.  Many air conditioners have filters that trap the pollen spores. Use a HEPA room air filter to remove pollen form the indoor air you breathe.  Control of Dog or Cat Allergen  Avoidance is the best way to manage a dog or cat allergy. If you have a dog or cat and are allergic to dog or cats, consider removing the dog or cat from the home. If you have a dog or cat but don't want to find it a new home, or if your family wants a pet even though someone in the household is allergic, here are some strategies that may help keep symptoms at bay:  Keep the pet out of your bedroom and restrict it to only a few rooms. Be advised that keeping the dog or cat in only one room will not limit the allergens to that room. Don't pet, hug or kiss the dog or cat; if you do, wash your hands with soap and water. High-efficiency particulate air (HEPA) cleaners run continuously in a bedroom or living room can reduce allergen levels over time. Regular use of a high-efficiency vacuum cleaner or a central vacuum can reduce allergen levels. Giving your dog or cat a bath at least once a week can reduce airborne allergen.  DUST MITE AVOIDANCE MEASURES:  There are  three main measures that need and can be taken to avoid house dust mites:  Reduce accumulation of dust in general -reduce furniture, clothing, carpeting, books, stuffed animals, especially in bedroom  Separate yourself from the dust -use pillow and mattress encasements (can be found at stores such as Bed, Bath, and Beyond or online) -avoid direct exposure to air condition flow -use a HEPA filter device, especially in the bedroom; you can also use a HEPA filter vacuum cleaner -wipe dust with a moist towel instead of a dry towel or broom when cleaning  Decrease mites and/or their secretions -wash clothing and linen and stuffed animals at highest temperature possible, at least every 2 weeks -stuffed animals can also be placed in a bag and put in a freezer overnight  Despite the above measures, it is impossible to eliminate dust mites or their allergen completely from your home.  With the above measures the burden of mites in your home can be diminished, with the goal of minimizing your allergic symptoms.  Success will be reached only when implementing and using all means together.  Control of Mold Allergen   Mold and fungi can grow on a variety of surfaces provided certain temperature and moisture conditions exist.  Outdoor molds grow on plants, decaying vegetation and soil.  The major outdoor mold, Alternaria and Cladosporium, are found in very high numbers during hot and  dry conditions.  Generally, a late Summer - Fall peak is seen for common outdoor fungal spores.  Rain will temporarily lower outdoor mold spore count, but counts rise rapidly when the rainy period ends.  The most important indoor molds are Aspergillus and Penicillium.  Dark, humid and poorly ventilated basements are ideal sites for mold growth.  The next most common sites of mold growth are the bathroom and the kitchen.  Outdoor (Seasonal) Mold Control  Positive outdoor molds via skin testing: Alternaria, Cladosporium,  Bipolaris (Helminthsporium), Drechslera (Curvalaria), and Mucor  Use air conditioning and keep windows closed Avoid exposure to decaying vegetation. Avoid leaf raking. Avoid grain handling. Consider wearing a face mask if working in moldy areas.    Indoor (Perennial) Mold Control   Positive indoor molds via skin testing: Aspergillus and Penicillium  Maintain humidity below 50%. Clean washable surfaces with 5% bleach solution. Remove sources e.g. contaminated carpets.      Meds ordered this encounter  Medications   DISCONTD: Fluticasone Propionate (XHANCE) 93 MCG/ACT EXHU    Sig: Place 1 spray into the nose in the morning and at bedtime.    Dispense:  16 mL    Refill:  6   Fluticasone Propionate (XHANCE) 93 MCG/ACT EXHU    Sig: Place 1 spray into the nose in the morning and at bedtime.    Dispense:  16 mL    Refill:  6   EPINEPHrine 0.3 mg/0.3 mL IJ SOAJ injection    Sig: Inject 0.3 mg into the muscle as needed for anaphylaxis.    Dispense:  1 each    Refill:  1   Lab Orders  No laboratory test(s) ordered today    Other allergy screening: Asthma: no Rhino conjunctivitis: no Food allergy: no Medication allergy: no Hymenoptera allergy: no Urticaria: no Eczema:no History of recurrent infections suggestive of immunodeficency: no  Diagnostics: Skin Testing: Environmental allergy panel. - Testing today showed positive to grass, trees, ragweed. Dust mite and cat - Intradermal testing  positive to dog and mold  Results interpreted by myself and discussed with patient/family.  Airborne Adult Perc - 11/18/21 0929     Time Antigen Placed [id number]    Allergen Manufacturer Waynette ButteryGreer    Location Back    Number of Test 59    2. Control-Histamine 1 mg/ml 3+    4. Bahia 3+    5. [place] 3+    6. Johnson 3+    7. Kentucky Blue 3+    8. Meadow Fescue 3+    9. Perennial Rye 3+    10. Sweet Vernal 3+    11. Timothy 3+    12. Cocklebur Negative    13. Burweed Marshelder  Negative    14. Ragweed, short Negative    15. Ragweed, Giant 3+    16. Plantain,  English Negative    17. Lamb's Quarters Negative    18. Sheep Sorrell Negative    19. Rough Pigweed Negative    20. Marsh Elder, Rough Negative    21. Mugwort, Common Negative    22. Ash mix Negative    23. Birch mix 3+    24. Beech American 3+    25. Box, Elder 3+    26. Cedar, red Negative    27. Cottonwood, Guinea[place]Biss[place]astern Negative    28. Elm mix Negative    29. Hickory Negative    30. Maple mix 3+    31. Oak, Guinea[place]iss[place]astern mix Negative    32. Pecan Pollen  4+    33. Pine mix Negative    34. Sycamore Eastern Negative    35. Walnut, Black Pollen Negative    36. Alternaria alternata Negative    37. Cladosporium Herbarum Negative    38. Aspergillus mix Negative    39. Penicillium mix Negative    40. Bipolaris sorokiniana (Helminthosporium) Negative    41. Drechslera spicifera (Curvularia) Negative    42. Mucor plumbeus Negative    43. Fusarium moniliforme Negative    44. Aureobasidium pullulans (pullulara) Negative    45. Rhizopus oryzae Negative    46. Botrytis cinera Negative    47. Epicoccum nigrum Negative    48. Phoma betae Negative    49. Candida Albicans Negative    50. Trichophyton mentagrophytes Negative    51. Mite, D Farinae  5,000 AU/ml 4+    52. Mite, D Pteronyssinus  5,000 AU/ml 4+    53. Cat Hair 10,000 BAU/ml 4+    54.  Dog Epithelia Negative    55. Mixed Feathers Negative    56. Horse Epithelia Negative    57. Cockroach, German Negative    58. Mouse Negative    59. Tobacco Leaf Negative             Intradermal - 11/18/21 1004     Time Antigen Placed 1005    Allergen Manufacturer Waynette Buttery    Location Arm    Number of Test 7    Intradermal Select    Control Negative    Mold 1 3+    Mold 2 3+    Mold 3 2+    Mold 4 Negative    Dog 4+    Cockroach Negative             Past Medical History: There are no problems to display for this patient.  Past Medical History:   Diagnosis Date   Alcoholism in remission (HCC)    Anxiety    Depression    GERD (gastroesophageal reflux disease)    Hypertension    Not currently on meds   Myopia of both eyes    Pneumothorax, right 2012   Recurrent upper respiratory infection (URI)    Past Surgical History: Past Surgical History:  Procedure Laterality Date   ESOPHAGOGASTRODUODENOSCOPY ENDOSCOPY     INGUINAL HERNIA REPAIR Left 12/31/2020   Procedure: OPEN LEFT INGUINAL HERNIA REPAIR WITH MESH;  Surgeon: Berna Bue, MD;  Location: [facility];  Service: General;  Laterality: Left;   TONSILLECTOMY     WISDOM TOOTH EXTRACTION     Medication List:  Current Outpatient Medications  Medication Sig Dispense Refill   busPIRone (BUSPAR) 15 MG tablet Take one tablet three times daily at least 7-8 hours between doses. 90 tablet 3   EPINEPHrine 0.3 mg/0.3 mL IJ SOAJ injection Inject 0.3 mg into the muscle as needed for anaphylaxis. 1 each 1   hydrOXYzine (ATARAX) 10 MG tablet Take 1 tablet (10 mg total) by mouth 3 (three) times daily as needed. 30 tablet 3   omeprazole (PRILOSEC) 40 MG capsule Take 40 mg by mouth 2 (two) times daily.     sertraline (ZOLOFT) 100 MG tablet TAKE 2 TABLETS(200 MG) BY MOUTH DAILY 60 tablet 3   sertraline (ZOLOFT) 50 MG tablet TAKE 1 TABLET(50 MG) BY MOUTH DAILY 30 tablet 3   traZODone (DESYREL) 100 MG tablet TAKE 1 TABLET(100 MG) BY MOUTH AT BEDTIME AS NEEDED FOR SLEEP 30 tablet 3   Fluticasone Propionate (XHANCE) 93 MCG/ACT EXHU  Place 1 spray into the nose in the morning and at bedtime. 16 mL 6   No current facility-administered medications for this visit.   Allergies: No Known Allergies Social History: Social History   Socioeconomic History   Marital status: Single    Spouse name: Not on file   Number of children: 2   Years of education: 16   Highest education level: Bachelor's degree (e.g., BA, AB, BS)  Occupational History   Occupation: Quarry manager:  [employer]  Tobacco Use   Smoking status: Never   Smokeless tobacco: Never  Vaping Use   Vaping Use: Never used  Substance and Sexual Activity   Alcohol use: Not Currently    Comment: attending AA. Reports no ETOH at this time   Drug use: Not Currently    Types: Benzodiazepines    Comment: Says he is not currently using xanax   Sexual activity: Yes    Partners: Female    Comment: married  Other Topics Concern   Not on file  Social History Narrative   Lives at home with spouse and two daughters aged 25 and 41. He is looking to find a Sales executive. Currently recovering alcoholic. Attends AA 5 times per week and Bible study two days per week. Report being dry since January 2022 and attending [employer].    Social Determinants of Health   Financial Resource Strain: Not on file  Food Insecurity: Not on file  Transportation Needs: Not on file  Physical Activity: Not on file  Stress: Not on file  Social Connections: Not on file   Lives in a single-family home that is 40 years old.  There are no roaches in the house and bed is 2 feet off the floor.  He does have dust mite precautions on bed and pillows.  He is not exposed to fumes, chemicals or dust.  There is a HEPA filter in the home and [place]. Smoking: No exposure Occupation: Information systems manager History: Environmental education officer in the house: no Charity fundraiser in the family room: no Carpet in the bedroom: no Heating: gas Cooling: central Pet: yes dog with access to bedroom  Family History: History reviewed. No pertinent family history.   ROS: All others negative except as noted per HPI.   Objective: BP 126/80   Pulse (!) 106   Temp 98.4 F (36.9 C)   Resp 18   Ht 5\' 11"  (1.803 m)   Wt 179 lb (81.2 kg)   SpO2 97%   BMI 24.97 kg/m  Body mass index is 24.97 kg/m.  General Appearance:  Alert, cooperative, no distress, appears stated age  Head:  Normocephalic,  without obvious abnormality, atraumatic  Eyes:  Conjunctiva clear, EOM's intact  Nose: Nares normal,  pale edematous nasal mucosa with copious clear rhinorrhea visible anterior nasal polyps on left, hypertrophic turbinates, and septum midline  Throat: Lips, tongue normal; teeth and gums normal, no tonsillar exudate and + cobblestoning  Neck: Supple, symmetrical  Lungs:   clear to auscultation bilaterally, Respirations unlabored, no coughing  Heart:  regular rate and rhythm and no murmur, Appears well perfused  Extremities: No edema  Skin: Skin color, texture, turgor normal, no rashes or lesions on visualized portions of skin  Neurologic: No gross deficits   The plan was reviewed with the patient/family, and all questions/concerned were addressed.  It was my pleasure to see [place] today and participate in his care.  Please feel free to contact me with any questions or concerns.  Sincerely,  Ferol Luz, MD Allergy & Immunology  [facility] office: [phone] [facility] office: [phone]

## 2021-11-18 NOTE — Telephone Encounter (Signed)
[place] called to clarify directions on prescription. Per Dr. Atilano Ina note, patient is to do 1-2 sprays in each nostril BID.

## 2021-11-21 NOTE — Progress Notes (Signed)
Aeroallergen Immunotherapy   Ordering Provider: Dr. Roney Marion   Patient Details  Name: Joseph Eaton  MRN: [id number]  Date of Birth: Jan 14, 1981   Order 2 of 2   Vial Label: G-W-T-D    0.3 ml (Volume)  BAU Concentration -- 7 Grass Mix* 100,000 ([address] [place], [place], [place], [place], [place], Sweet [place], [place])  0.2 ml (Volume)  1:20 Concentration -- [place]  0.3 ml (Volume)  BAU Concentration -- [place] 10,000  0.2 ml (Volume)  1:20 Concentration -- [place]  0.3 ml (Volume)  1:20 Concentration -- Ragweed Mix  0.5 ml (Volume)  1:20 Concentration -- Eastern 10 Tree Mix (also Sweet Gum)  0.2 ml (Volume)  1:20 Concentration -- Box Elder  0.2 ml (Volume)  1:10 Concentration -- Pecan Pollen  0.5 ml (Volume)  1:10 Concentration -- Dog Epithelia   2.7  ml Extract Subtotal  2.3  ml Diluent  5.0  ml Maintenance Total   Schedule:  RUSH  Silver Vial (1:1,000,000): RUSH  Blue Vial (1:100,000): RUSH  Yellow Vial (1:10,000): RUSH  Green Vial (1:1,000): Schedule A (10 doses)  Red Vial (1:100): Schedule B (6 doses)   Special Instructions: RUSH PROTOCOL

## 2021-11-21 NOTE — Progress Notes (Signed)
Aeroallergen Immunotherapy   Ordering Provider: Dr. Roney Marion   Patient Details  Name: Joseph Eaton  MRN: [id number]  Date of Birth: 05-Feb-1980   Order 1 of 2   Vial Label: Dm-M-C   0.2 ml (Volume)  1:20 Concentration -- Alternaria alternata  0.2 ml (Volume)  1:20 Concentration -- Cladosporium herbarum  0.2 ml (Volume)  1:10 Concentration -- Aspergillus mix  0.2 ml (Volume)  1:10 Concentration -- Penicillium mix  0.2 ml (Volume)  1:20 Concentration -- Bipolaris sorokiniana  0.2 ml (Volume)  1:20 Concentration -- Drechslera spicifera  0.2 ml (Volume)  1:10 Concentration -- Mucor plumbeus  0.5 ml (Volume)  1:10 Concentration -- Cat Hair  0.5 ml (Volume)   AU Concentration -- Mite Mix (DF 5,000 & DP 5,000)    2.4  ml Extract Subtotal  2.6  ml Diluent  5.0  ml Maintenance Total   Schedule:  RUSH  Silver Vial (1:1,000,000): RUSH  Blue Vial (1:100,000): RUSH  Yellow Vial (1:10,000): RUSH  Green Vial (1:1,000): Schedule A (10 doses)  Red Vial (1:100): Schedule B (6 doses)   Special Instructions: RUSH PROTOCOL

## 2021-11-21 NOTE — Progress Notes (Signed)
VIALS NOT MADE UNTIL APPT IS SCHED 

## 2021-11-23 ENCOUNTER — Telehealth: Payer: Self-pay

## 2021-11-23 ENCOUNTER — Other Ambulatory Visit (HOSPITAL_COMMUNITY): Payer: Self-pay

## 2021-11-23 DIAGNOSIS — J3089 Other allergic rhinitis: Secondary | ICD-10-CM | POA: Diagnosis not present

## 2021-11-23 NOTE — Telephone Encounter (Signed)
PA request received from [place] for [place].  PA submitted through [facility] and is awaiting determination.  Patient does have nasal polyps and has previously been prescribed Flonase.  Key: [id number]

## 2021-11-23 NOTE — Progress Notes (Signed)
VIALS EXP 11-24-22 

## 2021-11-23 NOTE — Telephone Encounter (Signed)
PA has been approved for Xhance 9mcg/act from 11/23/2021-11/23/2022 through [facility].  Key: [id number]

## 2021-11-24 DIAGNOSIS — J3081 Allergic rhinitis due to animal (cat) (dog) hair and dander: Secondary | ICD-10-CM | POA: Diagnosis not present

## 2021-12-13 ENCOUNTER — Encounter: Payer: Self-pay | Admitting: Internal Medicine

## 2021-12-13 ENCOUNTER — Ambulatory Visit (INDEPENDENT_AMBULATORY_CARE_PROVIDER_SITE_OTHER): Payer: BC Managed Care – PPO | Admitting: Internal Medicine

## 2021-12-13 VITALS — BP 110/84 | HR 80 | Temp 98.2°F | Resp 18

## 2021-12-13 DIAGNOSIS — J302 Other seasonal allergic rhinitis: Secondary | ICD-10-CM

## 2021-12-13 DIAGNOSIS — J3089 Other allergic rhinitis: Secondary | ICD-10-CM

## 2021-12-13 NOTE — Progress Notes (Unsigned)
   RAPID DESENSITIZATION Note  RE: Joseph Eaton MRN: [id number] DOB: February 14, 1980 Date of Office Visit: 12/13/2021  Subjective:  Patient presents today for rapid desensitization.  Interval History: Patient has not been ill, he has taken all premedications as per protocol.  Recent/Current History: Pulmonary disease: no Cardiac disease: no Respiratory infection: no Rash: no Itch: no Swelling: no Cough: no Shortness of breath: no Runny/stuffy nose: no Itchy eyes: no Beta-blocker use: no  Patient/guardian was informed of the procedure with verbalized understanding of the risk of anaphylaxis. Consent has been signed.   Medication List:  Current Outpatient Medications  Medication Sig Dispense Refill   busPIRone (BUSPAR) 15 MG tablet Take one tablet three times daily at least 7-8 hours between doses. 90 tablet 3   EPINEPHrine 0.3 mg/0.3 mL IJ SOAJ injection Inject 0.3 mg into the muscle as needed for anaphylaxis. 1 each 1   Fluticasone Propionate (XHANCE) 93 MCG/ACT EXHU Place 1 spray into the nose in the morning and at bedtime. 16 mL 6   hydrOXYzine (ATARAX) 10 MG tablet Take 1 tablet (10 mg total) by mouth 3 (three) times daily as needed. 30 tablet 3   omeprazole (PRILOSEC) 40 MG capsule Take 40 mg by mouth 2 (two) times daily.     sertraline (ZOLOFT) 100 MG tablet TAKE 2 TABLETS(200 MG) BY MOUTH DAILY 60 tablet 3   sertraline (ZOLOFT) 50 MG tablet TAKE 1 TABLET(50 MG) BY MOUTH DAILY 30 tablet 3   traZODone (DESYREL) 100 MG tablet TAKE 1 TABLET(100 MG) BY MOUTH AT BEDTIME AS NEEDED FOR SLEEP 30 tablet 3   No current facility-administered medications for this visit.   Allergies: No Known Allergies I reviewed his past medical history, social history, family history, and environmental history and no significant changes have been reported from his previous visit.  ROS: Negative except as per HPI.  Objective: There were no vitals taken for this visit. There is no height or  weight on file to calculate BMI.   General Appearance:  Alert, cooperative, no distress, appears stated age  Head:  Normocephalic, without obvious abnormality, atraumatic  Eyes:  Conjunctiva clear, EOM's intact  Nose: Nares normal  Throat: Lips, tongue normal; teeth and gums normal, normal  posterior oropharnyx  Neck: Supple, symmetrical  Lungs:   CTAB , Respirations unlabored, no coughing  Heart:  Appears well perfused  Extremities: No edema  Skin: Skin color, texture, turgor normal, no rashes or lesions on visualized portions of skin  Neurologic: No gross deficits     Diagnostics:  PROCEDURES:  Patient received the following doses every hour: Step 1:  0.66ml - 1:1,000,000 dilution (silver vial) Step 2:  0.23ml - 1:1,000,000 dilution (silver vial) Step 3: 0.73ml - 1:100,000 dilution (blue vial)  Step 4: 0.12ml - 1:100,000 dilution (blue vial)  Step 5: 0.13ml - 1:10,000 dilution (gold vial) Step 6: 0.46ml - 1:10,000 dilution (gold vial) Step 7: 0.29ml - 1:10,000 dilution (gold vial) Step 8: 0.51ml - 1:10,000 dilution (gold vial)  Patient was observed for 1 hour after the last dose.   Procedure started at 8:53 Procedure ended at 4:15   ASSESSMENT/PLAN:   Patient has tolerated the rapid desensitization protocol.  Next appointment: Start at 0.54ml of 1:1000 dilution (green vial) and build up per protocol.

## 2021-12-17 ENCOUNTER — Other Ambulatory Visit: Payer: Self-pay | Admitting: Behavioral Health

## 2021-12-17 DIAGNOSIS — F411 Generalized anxiety disorder: Secondary | ICD-10-CM

## 2021-12-18 ENCOUNTER — Other Ambulatory Visit: Payer: Self-pay | Admitting: Behavioral Health

## 2021-12-18 DIAGNOSIS — F411 Generalized anxiety disorder: Secondary | ICD-10-CM

## 2021-12-18 DIAGNOSIS — F330 Major depressive disorder, recurrent, mild: Secondary | ICD-10-CM

## 2021-12-18 DIAGNOSIS — F3341 Major depressive disorder, recurrent, in partial remission: Secondary | ICD-10-CM

## 2021-12-26 ENCOUNTER — Ambulatory Visit (INDEPENDENT_AMBULATORY_CARE_PROVIDER_SITE_OTHER): Payer: BC Managed Care – PPO

## 2021-12-26 DIAGNOSIS — J309 Allergic rhinitis, unspecified: Secondary | ICD-10-CM

## 2022-01-05 ENCOUNTER — Encounter: Payer: Self-pay | Admitting: Behavioral Health

## 2022-01-05 ENCOUNTER — Ambulatory Visit (INDEPENDENT_AMBULATORY_CARE_PROVIDER_SITE_OTHER): Payer: BC Managed Care – PPO | Admitting: Behavioral Health

## 2022-01-05 DIAGNOSIS — F3341 Major depressive disorder, recurrent, in partial remission: Secondary | ICD-10-CM | POA: Diagnosis not present

## 2022-01-05 DIAGNOSIS — F330 Major depressive disorder, recurrent, mild: Secondary | ICD-10-CM

## 2022-01-05 DIAGNOSIS — F411 Generalized anxiety disorder: Secondary | ICD-10-CM

## 2022-01-05 MED ORDER — SERTRALINE HCL 50 MG PO TABS: ORAL_TABLET | ORAL | 4 refills | Status: DC

## 2022-01-05 MED ORDER — BUSPIRONE HCL 15 MG PO TABS: ORAL_TABLET | ORAL | 4 refills | Status: DC

## 2022-01-05 MED ORDER — HYDROXYZINE HCL 10 MG PO TABS: ORAL_TABLET | ORAL | 4 refills | Status: DC

## 2022-01-05 MED ORDER — SERTRALINE HCL 100 MG PO TABS: ORAL_TABLET | ORAL | 4 refills | Status: DC

## 2022-01-05 NOTE — Progress Notes (Signed)
[facility]  Patient ID: Joseph Eaton,  MRN: [id number]  PCP: Farris Has, MD  Date of Evaluation: 01/05/2022 Time spent:30 minutes  Chief Complaint:  Chief Complaint   Anxiety; Depression; Follow-up; Medication Refill; Patient Education     HISTORY/CURRENT STATUS: HPI  41 year old patient presents to this office for follow up and medication management. Says that he is still doing pretty well overall. No changes this visit. Says hydroxyzine is really helping especially with that increased anxiety he experiences in the morning before work.  Says that his wife has seen the difference. He request no changes this visit and would like to continue his current medication regimen. He is requesting medication that could help with the intermittent increase anxiety.  Says that family life has been improving. Says he still is active in AA weekly and Bible study.  Reports anxiety 2/10, depression 1/10. Sleeping 7-8 hours per evening. No mania, psychosis. No SI/HI.     Prior psychiatric medication trials: Abilify Inderal Prozac Effexor  Individual Medical History/ Review of Systems: Changes? :No   Allergies: Patient has no known allergies.  Current Medications:  Current Outpatient Medications:    busPIRone (BUSPAR) 15 MG tablet, TAKE 1 TABLET BY MOUTH THREE TIMES DAILY AT LEAST 7 TO 8 HOURS BETWEEN DOSES, Disp: 90 tablet, Rfl: 0   EPINEPHrine 0.3 mg/0.3 mL IJ SOAJ injection, Inject 0.3 mg into the muscle as needed for anaphylaxis., Disp: 1 each, Rfl: 1   Fluticasone Propionate (XHANCE) 93 MCG/ACT EXHU, Place 1 spray into the nose in the morning and at bedtime., Disp: 16 mL, Rfl: 6   hydrOXYzine (ATARAX) 10 MG tablet, TAKE 1 TABLET(10 MG) BY MOUTH THREE TIMES DAILY AS NEEDED, Disp: 90 tablet, Rfl: 0   omeprazole (PRILOSEC) 40 MG capsule, Take 40 mg by mouth 2 (two) times daily., Disp: , Rfl:    sertraline (ZOLOFT) 100 MG tablet, TAKE 2 TABLETS(200 MG) BY MOUTH DAILY, Disp: 60  tablet, Rfl: 0   sertraline (ZOLOFT) 50 MG tablet, TAKE 1 TABLET(50 MG) BY MOUTH DAILY, Disp: 30 tablet, Rfl: 0   traZODone (DESYREL) 100 MG tablet, TAKE 1 TABLET(100 MG) BY MOUTH AT BEDTIME AS NEEDED FOR SLEEP, Disp: 30 tablet, Rfl: 3 Medication Side Effects: none  Family Medical/ Social History: Changes? No  MENTAL HEALTH EXAM:  There were no vitals taken for this visit.There is no height or weight on file to calculate BMI.  General Appearance: Casual, Neat, and Well Groomed  Eye Contact:  Good  Speech:  Clear and Coherent  Volume:  Normal  Mood:  NA  Affect:  Appropriate  Thought Process:  Coherent  Orientation:  Full (Time, Place, and Person)  Thought Content: Logical   Suicidal Thoughts:  No  Homicidal Thoughts:  No  Memory:  WNL  Judgement:  Good  Insight:  Good  Psychomotor Activity:  Normal  Concentration:  Concentration: Good  Recall:  Good  Fund of Knowledge: Good  Language: Good  Assets:  Desire for Improvement  ADL's:  Intact  Cognition: WNL  Prognosis:  Good    DIAGNOSES:    ICD-10-CM   1. Generalized anxiety disorder  F41.1     2. Mild episode of recurrent major depressive disorder (HCC)  F33.0     3. Recurrent major depressive disorder, in partial remission (HCC)  F33.41       Receiving Psychotherapy: No    RECOMMENDATIONS:   Continue Zoloft  250 mg daily   Continue Buspar 15 mg three  times daily for anxiety Continue Trazodone 100 mg daily at bedtime for sleep Continue hydroxyzine 10 mg three times daily as needed for increased anxiety.  Will report and new side effects or worsening symptoms Provided after hours emergency contact information To follow up in 4 months to reassess. Refills sent Greater than 50% of 30 min.face to face time with patient was spent on counseling and coordination of care. He is very happy with the hydroxyzine. Feels like it is help with anxiety especially in the morning.  My opinion is that it is predominately job  related and mostly occurs in the morning before he goes to work. He is still participating in AA regularly and currently continues in remission.  Discussed good sleep hygiene and smart nutrition.       Joan Flores, NP

## 2022-01-09 ENCOUNTER — Ambulatory Visit (INDEPENDENT_AMBULATORY_CARE_PROVIDER_SITE_OTHER): Payer: BC Managed Care – PPO

## 2022-01-09 DIAGNOSIS — J309 Allergic rhinitis, unspecified: Secondary | ICD-10-CM | POA: Diagnosis not present

## 2022-01-17 ENCOUNTER — Ambulatory Visit (INDEPENDENT_AMBULATORY_CARE_PROVIDER_SITE_OTHER): Payer: BC Managed Care – PPO

## 2022-01-17 DIAGNOSIS — J309 Allergic rhinitis, unspecified: Secondary | ICD-10-CM | POA: Diagnosis not present

## 2022-01-25 ENCOUNTER — Ambulatory Visit (INDEPENDENT_AMBULATORY_CARE_PROVIDER_SITE_OTHER): Payer: BC Managed Care – PPO

## 2022-01-25 DIAGNOSIS — J309 Allergic rhinitis, unspecified: Secondary | ICD-10-CM | POA: Diagnosis not present

## 2022-01-31 ENCOUNTER — Ambulatory Visit (INDEPENDENT_AMBULATORY_CARE_PROVIDER_SITE_OTHER): Payer: BC Managed Care – PPO

## 2022-01-31 DIAGNOSIS — J309 Allergic rhinitis, unspecified: Secondary | ICD-10-CM | POA: Diagnosis not present

## 2022-02-06 ENCOUNTER — Ambulatory Visit (INDEPENDENT_AMBULATORY_CARE_PROVIDER_SITE_OTHER): Payer: BC Managed Care – PPO

## 2022-02-06 DIAGNOSIS — J309 Allergic rhinitis, unspecified: Secondary | ICD-10-CM | POA: Diagnosis not present

## 2022-02-17 ENCOUNTER — Ambulatory Visit (INDEPENDENT_AMBULATORY_CARE_PROVIDER_SITE_OTHER): Payer: BC Managed Care – PPO

## 2022-02-17 DIAGNOSIS — J309 Allergic rhinitis, unspecified: Secondary | ICD-10-CM | POA: Diagnosis not present

## 2022-02-22 ENCOUNTER — Other Ambulatory Visit: Payer: Self-pay | Admitting: Behavioral Health

## 2022-02-22 DIAGNOSIS — F411 Generalized anxiety disorder: Secondary | ICD-10-CM

## 2022-02-24 ENCOUNTER — Ambulatory Visit (INDEPENDENT_AMBULATORY_CARE_PROVIDER_SITE_OTHER): Payer: BC Managed Care – PPO

## 2022-02-24 DIAGNOSIS — J309 Allergic rhinitis, unspecified: Secondary | ICD-10-CM | POA: Diagnosis not present

## 2022-02-28 ENCOUNTER — Telehealth: Payer: Self-pay | Admitting: Behavioral Health

## 2022-02-28 NOTE — Telephone Encounter (Signed)
LVM to rtc 

## 2022-02-28 NOTE — Telephone Encounter (Signed)
PT LVM@ 9:29a.  He said he got a refill of Sertraline, but only got 30 pills and he should have gotten 60 pills because he takes 2 a day.  He said his insurance doesn't want to pay.  He needs help getting this fixed.  Next appt 4/8

## 2022-03-01 NOTE — Telephone Encounter (Signed)
Still unable to reach pt

## 2022-03-02 ENCOUNTER — Other Ambulatory Visit: Payer: Self-pay

## 2022-03-02 DIAGNOSIS — F3341 Major depressive disorder, recurrent, in partial remission: Secondary | ICD-10-CM

## 2022-03-02 DIAGNOSIS — F330 Major depressive disorder, recurrent, mild: Secondary | ICD-10-CM

## 2022-03-02 DIAGNOSIS — F411 Generalized anxiety disorder: Secondary | ICD-10-CM

## 2022-03-02 MED ORDER — SERTRALINE HCL 100 MG PO TABS: 200.0000 mg | ORAL_TABLET | Freq: Every day | ORAL | 4 refills | Status: DC

## 2022-03-02 NOTE — Telephone Encounter (Signed)
LVM again

## 2022-03-03 ENCOUNTER — Ambulatory Visit (INDEPENDENT_AMBULATORY_CARE_PROVIDER_SITE_OTHER): Payer: BC Managed Care – PPO | Admitting: *Deleted

## 2022-03-03 DIAGNOSIS — J309 Allergic rhinitis, unspecified: Secondary | ICD-10-CM

## 2022-03-10 ENCOUNTER — Ambulatory Visit (INDEPENDENT_AMBULATORY_CARE_PROVIDER_SITE_OTHER): Payer: BC Managed Care – PPO

## 2022-03-10 DIAGNOSIS — J309 Allergic rhinitis, unspecified: Secondary | ICD-10-CM

## 2022-03-17 ENCOUNTER — Ambulatory Visit (INDEPENDENT_AMBULATORY_CARE_PROVIDER_SITE_OTHER): Payer: BC Managed Care – PPO | Admitting: *Deleted

## 2022-03-17 DIAGNOSIS — J309 Allergic rhinitis, unspecified: Secondary | ICD-10-CM | POA: Diagnosis not present

## 2022-03-27 ENCOUNTER — Ambulatory Visit (INDEPENDENT_AMBULATORY_CARE_PROVIDER_SITE_OTHER): Payer: BC Managed Care – PPO | Admitting: *Deleted

## 2022-03-27 DIAGNOSIS — J309 Allergic rhinitis, unspecified: Secondary | ICD-10-CM

## 2022-04-03 ENCOUNTER — Ambulatory Visit (INDEPENDENT_AMBULATORY_CARE_PROVIDER_SITE_OTHER): Payer: BC Managed Care – PPO | Admitting: *Deleted

## 2022-04-03 DIAGNOSIS — J309 Allergic rhinitis, unspecified: Secondary | ICD-10-CM

## 2022-04-07 ENCOUNTER — Ambulatory Visit (INDEPENDENT_AMBULATORY_CARE_PROVIDER_SITE_OTHER): Payer: BC Managed Care – PPO

## 2022-04-07 DIAGNOSIS — J309 Allergic rhinitis, unspecified: Secondary | ICD-10-CM | POA: Diagnosis not present

## 2022-04-14 ENCOUNTER — Ambulatory Visit (INDEPENDENT_AMBULATORY_CARE_PROVIDER_SITE_OTHER): Payer: BC Managed Care – PPO

## 2022-04-14 DIAGNOSIS — J309 Allergic rhinitis, unspecified: Secondary | ICD-10-CM

## 2022-04-21 ENCOUNTER — Ambulatory Visit (INDEPENDENT_AMBULATORY_CARE_PROVIDER_SITE_OTHER): Payer: BC Managed Care – PPO

## 2022-04-21 DIAGNOSIS — J309 Allergic rhinitis, unspecified: Secondary | ICD-10-CM

## 2022-04-25 DIAGNOSIS — J3089 Other allergic rhinitis: Secondary | ICD-10-CM | POA: Diagnosis not present

## 2022-04-25 NOTE — Progress Notes (Signed)
VIALS EXP 04-25-23

## 2022-05-01 ENCOUNTER — Ambulatory Visit (INDEPENDENT_AMBULATORY_CARE_PROVIDER_SITE_OTHER): Payer: BC Managed Care – PPO | Admitting: *Deleted

## 2022-05-01 DIAGNOSIS — J309 Allergic rhinitis, unspecified: Secondary | ICD-10-CM | POA: Diagnosis not present

## 2022-05-05 ENCOUNTER — Ambulatory Visit (INDEPENDENT_AMBULATORY_CARE_PROVIDER_SITE_OTHER): Payer: BC Managed Care – PPO

## 2022-05-05 DIAGNOSIS — J309 Allergic rhinitis, unspecified: Secondary | ICD-10-CM

## 2022-05-08 ENCOUNTER — Ambulatory Visit (INDEPENDENT_AMBULATORY_CARE_PROVIDER_SITE_OTHER): Payer: BC Managed Care – PPO | Admitting: Behavioral Health

## 2022-05-08 ENCOUNTER — Encounter: Payer: Self-pay | Admitting: Behavioral Health

## 2022-05-08 DIAGNOSIS — F330 Major depressive disorder, recurrent, mild: Secondary | ICD-10-CM | POA: Diagnosis not present

## 2022-05-08 DIAGNOSIS — F411 Generalized anxiety disorder: Secondary | ICD-10-CM

## 2022-05-08 DIAGNOSIS — F3341 Major depressive disorder, recurrent, in partial remission: Secondary | ICD-10-CM | POA: Diagnosis not present

## 2022-05-08 MED ORDER — HYDROXYZINE HCL 10 MG PO TABS: ORAL_TABLET | ORAL | 4 refills | Status: DC

## 2022-05-08 MED ORDER — SERTRALINE HCL 100 MG PO TABS
200.0000 mg | ORAL_TABLET | Freq: Every day | ORAL | 4 refills | Status: DC
Start: 2022-05-08 — End: 2022-07-20

## 2022-05-08 MED ORDER — BUSPIRONE HCL 15 MG PO TABS
ORAL_TABLET | ORAL | 4 refills | Status: DC
Start: 2022-05-08 — End: 2023-04-27

## 2022-05-08 MED ORDER — TRAZODONE HCL 100 MG PO TABS
ORAL_TABLET | ORAL | 3 refills | Status: DC
Start: 2022-05-08 — End: 2022-10-09

## 2022-05-08 NOTE — Progress Notes (Signed)
[facility]  Patient ID: Joseph Eaton,  MRN: [id number]  PCP: Farris Has, MD  Date of Evaluation: 05/08/2022 Time spent:30 minutes  Chief Complaint:  Chief Complaint   Anxiety; Depression; Medication Refill; Follow-up; Patient Education     HISTORY/CURRENT STATUS: HPI 42 year old patient presents to this office for follow up and medication management. Says that he is still doing pretty well overall. No changes this visit. Says hydroxyzine is really helping especially with that increased anxiety he experiences in the morning before work.   Says that family life has been improving. Says he still is active in AA weekly and Bible study.  Reports anxiety 2/10, depression 1/10. Sleeping 7-8 hours per evening. No mania, psychosis. No SI/HI.     Prior psychiatric medication trials: Abilify Inderal Prozac Effexor Individual Medical History/ Review of Systems: Changes? :No   Allergies: Patient has no known allergies.  Current Medications:  Current Outpatient Medications:    busPIRone (BUSPAR) 15 MG tablet, TAKE 1 TABLET BY MOUTH THREE TIMES DAILY AT LEAST 7 TO 8 HOURS BETWEEN DOSES, Disp: 90 tablet, Rfl: 4   EPINEPHrine 0.3 mg/0.3 mL IJ SOAJ injection, Inject 0.3 mg into the muscle as needed for anaphylaxis., Disp: 1 each, Rfl: 1   Fluticasone Propionate (XHANCE) 93 MCG/ACT EXHU, Place 1 spray into the nose in the morning and at bedtime., Disp: 16 mL, Rfl: 6   hydrOXYzine (ATARAX) 10 MG tablet, TAKE 1 TABLET(10 MG) BY MOUTH THREE TIMES DAILY AS NEEDED, Disp: 90 tablet, Rfl: 4   omeprazole (PRILOSEC) 40 MG capsule, Take 40 mg by mouth 2 (two) times daily., Disp: , Rfl:    sertraline (ZOLOFT) 100 MG tablet, Take 2 tablets (200 mg total) by mouth daily. Take one tablet by mouth daily, Disp: 60 tablet, Rfl: 4   sertraline (ZOLOFT) 50 MG tablet, Take one tablet by mouth daily, Disp: 30 tablet, Rfl: 4   traZODone (DESYREL) 100 MG tablet, TAKE 1 TABLET(100 MG) BY MOUTH AT  BEDTIME AS NEEDED FOR SLEEP, Disp: 30 tablet, Rfl: 3 Medication Side Effects: none  Family Medical/ Social History: Changes? No  MENTAL HEALTH EXAM:  There were no vitals taken for this visit.There is no height or weight on file to calculate BMI.  General Appearance: Casual, Neat, and Well Groomed  Eye Contact:  Good  Speech:  Clear and Coherent  Volume:  Normal  Mood:  NA  Affect:  Appropriate  Thought Process:  Coherent  Orientation:  Full (Time, Place, and Person)  Thought Content: Logical   Suicidal Thoughts:  No  Homicidal Thoughts:  No  Memory:  WNL  Judgement:  Good  Insight:  Good  Psychomotor Activity:  Normal  Concentration:  Concentration: Good  Recall:  Good  Fund of Knowledge: Good  Language: Good  Assets:  Desire for Improvement  ADL's:  Intact  Cognition: WNL  Prognosis:  Good    DIAGNOSES: No diagnosis found.  Receiving Psychotherapy: No    RECOMMENDATIONS:   Continue Zoloft  250 mg daily   Continue Buspar 15 mg three times daily for anxiety Continue Trazodone 100 mg daily at bedtime for sleep Continue hydroxyzine 10 mg three times daily as needed for increased anxiety.  Will report and new side effects or worsening symptoms Provided after hours emergency contact information To follow up in 4 months to reassess. Refills sent Greater than 50% of 30 min.face to face time with patient was spent on counseling and coordination of care. No changes this visit.  He is very happy with his medication and current level of stability. Feels like it is help with anxiety especially in the morning.  My opinion is that it is predominately job related and mostly occurs in the morning before he goes to work. He is still participating in AA regularly and currently continues in remission.  Discussed good sleep hygiene and smart nutrition.     Joan Flores, NP

## 2022-05-15 ENCOUNTER — Ambulatory Visit (INDEPENDENT_AMBULATORY_CARE_PROVIDER_SITE_OTHER): Payer: BC Managed Care – PPO | Admitting: *Deleted

## 2022-05-15 DIAGNOSIS — J309 Allergic rhinitis, unspecified: Secondary | ICD-10-CM | POA: Diagnosis not present

## 2022-05-19 ENCOUNTER — Ambulatory Visit: Payer: BC Managed Care – PPO | Admitting: Internal Medicine

## 2022-05-26 ENCOUNTER — Ambulatory Visit (INDEPENDENT_AMBULATORY_CARE_PROVIDER_SITE_OTHER): Payer: BC Managed Care – PPO

## 2022-05-26 DIAGNOSIS — J309 Allergic rhinitis, unspecified: Secondary | ICD-10-CM

## 2022-06-02 ENCOUNTER — Ambulatory Visit (INDEPENDENT_AMBULATORY_CARE_PROVIDER_SITE_OTHER): Payer: BC Managed Care – PPO

## 2022-06-02 DIAGNOSIS — J309 Allergic rhinitis, unspecified: Secondary | ICD-10-CM

## 2022-06-09 ENCOUNTER — Ambulatory Visit (INDEPENDENT_AMBULATORY_CARE_PROVIDER_SITE_OTHER): Payer: BC Managed Care – PPO | Admitting: *Deleted

## 2022-06-09 DIAGNOSIS — J309 Allergic rhinitis, unspecified: Secondary | ICD-10-CM | POA: Diagnosis not present

## 2022-06-19 ENCOUNTER — Ambulatory Visit (INDEPENDENT_AMBULATORY_CARE_PROVIDER_SITE_OTHER): Payer: BC Managed Care – PPO

## 2022-06-19 DIAGNOSIS — J309 Allergic rhinitis, unspecified: Secondary | ICD-10-CM | POA: Diagnosis not present

## 2022-06-30 ENCOUNTER — Ambulatory Visit (INDEPENDENT_AMBULATORY_CARE_PROVIDER_SITE_OTHER): Payer: BC Managed Care – PPO

## 2022-06-30 DIAGNOSIS — J309 Allergic rhinitis, unspecified: Secondary | ICD-10-CM

## 2022-07-07 ENCOUNTER — Ambulatory Visit (INDEPENDENT_AMBULATORY_CARE_PROVIDER_SITE_OTHER): Payer: BC Managed Care – PPO | Admitting: *Deleted

## 2022-07-07 DIAGNOSIS — J309 Allergic rhinitis, unspecified: Secondary | ICD-10-CM

## 2022-07-17 ENCOUNTER — Ambulatory Visit (INDEPENDENT_AMBULATORY_CARE_PROVIDER_SITE_OTHER): Payer: BC Managed Care – PPO

## 2022-07-17 DIAGNOSIS — J309 Allergic rhinitis, unspecified: Secondary | ICD-10-CM | POA: Diagnosis not present

## 2022-07-17 DIAGNOSIS — M79601 Pain in right arm: Secondary | ICD-10-CM | POA: Diagnosis not present

## 2022-07-17 DIAGNOSIS — M25521 Pain in right elbow: Secondary | ICD-10-CM | POA: Diagnosis not present

## 2022-07-18 ENCOUNTER — Other Ambulatory Visit: Payer: Self-pay | Admitting: Behavioral Health

## 2022-07-18 DIAGNOSIS — F3341 Major depressive disorder, recurrent, in partial remission: Secondary | ICD-10-CM

## 2022-07-18 DIAGNOSIS — F411 Generalized anxiety disorder: Secondary | ICD-10-CM

## 2022-07-18 DIAGNOSIS — F330 Major depressive disorder, recurrent, mild: Secondary | ICD-10-CM

## 2022-07-18 DIAGNOSIS — M25521 Pain in right elbow: Secondary | ICD-10-CM | POA: Diagnosis not present

## 2022-07-19 NOTE — Telephone Encounter (Signed)
Last note:  Continue Zoloft  250 mg daily   Verify dose.

## 2022-07-21 DIAGNOSIS — S46211A Strain of muscle, fascia and tendon of other parts of biceps, right arm, initial encounter: Secondary | ICD-10-CM | POA: Diagnosis not present

## 2022-07-24 ENCOUNTER — Ambulatory Visit (INDEPENDENT_AMBULATORY_CARE_PROVIDER_SITE_OTHER): Payer: BC Managed Care – PPO

## 2022-07-24 DIAGNOSIS — J309 Allergic rhinitis, unspecified: Secondary | ICD-10-CM | POA: Diagnosis not present

## 2022-07-26 DIAGNOSIS — X58XXXA Exposure to other specified factors, initial encounter: Secondary | ICD-10-CM | POA: Diagnosis not present

## 2022-07-26 DIAGNOSIS — J3089 Other allergic rhinitis: Secondary | ICD-10-CM | POA: Diagnosis not present

## 2022-07-26 DIAGNOSIS — S46291A Other injury of muscle, fascia and tendon of other parts of biceps, right arm, initial encounter: Secondary | ICD-10-CM | POA: Diagnosis not present

## 2022-07-26 DIAGNOSIS — S46211A Strain of muscle, fascia and tendon of other parts of biceps, right arm, initial encounter: Secondary | ICD-10-CM | POA: Diagnosis not present

## 2022-07-26 DIAGNOSIS — Y9322 Activity, ice hockey: Secondary | ICD-10-CM | POA: Diagnosis not present

## 2022-07-26 NOTE — Progress Notes (Signed)
VIALS EXP 07-26-23

## 2022-07-31 ENCOUNTER — Ambulatory Visit (INDEPENDENT_AMBULATORY_CARE_PROVIDER_SITE_OTHER): Payer: BC Managed Care – PPO

## 2022-07-31 DIAGNOSIS — J309 Allergic rhinitis, unspecified: Secondary | ICD-10-CM | POA: Diagnosis not present

## 2022-08-07 DIAGNOSIS — M25621 Stiffness of right elbow, not elsewhere classified: Secondary | ICD-10-CM | POA: Diagnosis not present

## 2022-08-11 ENCOUNTER — Ambulatory Visit (INDEPENDENT_AMBULATORY_CARE_PROVIDER_SITE_OTHER): Payer: BC Managed Care – PPO

## 2022-08-11 DIAGNOSIS — J309 Allergic rhinitis, unspecified: Secondary | ICD-10-CM

## 2022-08-15 DIAGNOSIS — M25621 Stiffness of right elbow, not elsewhere classified: Secondary | ICD-10-CM | POA: Diagnosis not present

## 2022-08-24 ENCOUNTER — Ambulatory Visit (INDEPENDENT_AMBULATORY_CARE_PROVIDER_SITE_OTHER): Payer: BC Managed Care – PPO

## 2022-08-24 DIAGNOSIS — J309 Allergic rhinitis, unspecified: Secondary | ICD-10-CM

## 2022-08-25 DIAGNOSIS — M25521 Pain in right elbow: Secondary | ICD-10-CM | POA: Diagnosis not present

## 2022-09-01 DIAGNOSIS — M25521 Pain in right elbow: Secondary | ICD-10-CM | POA: Diagnosis not present

## 2022-09-25 ENCOUNTER — Ambulatory Visit (INDEPENDENT_AMBULATORY_CARE_PROVIDER_SITE_OTHER): Payer: Self-pay

## 2022-09-25 DIAGNOSIS — J309 Allergic rhinitis, unspecified: Secondary | ICD-10-CM | POA: Diagnosis not present

## 2022-10-09 ENCOUNTER — Ambulatory Visit (INDEPENDENT_AMBULATORY_CARE_PROVIDER_SITE_OTHER): Payer: BC Managed Care – PPO | Admitting: Behavioral Health

## 2022-10-09 ENCOUNTER — Encounter: Payer: Self-pay | Admitting: Behavioral Health

## 2022-10-09 DIAGNOSIS — F3341 Major depressive disorder, recurrent, in partial remission: Secondary | ICD-10-CM | POA: Diagnosis not present

## 2022-10-09 DIAGNOSIS — F330 Major depressive disorder, recurrent, mild: Secondary | ICD-10-CM | POA: Diagnosis not present

## 2022-10-09 DIAGNOSIS — F411 Generalized anxiety disorder: Secondary | ICD-10-CM

## 2022-10-09 MED ORDER — HYDROXYZINE HCL 10 MG PO TABS
ORAL_TABLET | ORAL | 1 refills | Status: DC
Start: 2022-10-09 — End: 2022-12-31

## 2022-10-09 MED ORDER — SERTRALINE HCL 100 MG PO TABS
100.0000 mg | ORAL_TABLET | Freq: Every day | ORAL | 5 refills | Status: DC
Start: 2022-10-09 — End: 2023-04-27

## 2022-10-09 MED ORDER — TRAZODONE HCL 100 MG PO TABS: ORAL_TABLET | ORAL | 1 refills | Status: DC

## 2022-10-09 NOTE — Progress Notes (Signed)
[facility]  Patient ID: Haochen Hecht,  MRN: [id number]  PCP: Farris Has, MD  Date of Evaluation: 10/09/2022 Time spent:30 minutes  Chief Complaint:  Chief Complaint   Depression; Anxiety; Follow-up; Medication Refill; Patient Education     HISTORY/CURRENT STATUS: HPI  42 year old patient presents to this office for follow up and medication management. Says that he is still doing pretty well overall. He was recently on vacation and forgot his Buspar. Says he decided to continue to not take medication and has been fine. He would like to not make any further changes this visit.  Says hydroxyzine is really helping especially with that increased anxiety he experiences in the morning before work.   Says that family life has been improving. Says he still is active in AA weekly and Bible study.  Reports anxiety 2/10, depression 1/10. Sleeping 7-8 hours per evening. No mania, psychosis. No SI/HI.     Prior psychiatric medication trials: Abilify Inderal Prozac    Individual Medical History/ Review of Systems: Changes? :No   Allergies: Patient has no known allergies.  Current Medications:  Current Outpatient Medications:    busPIRone (BUSPAR) 15 MG tablet, TAKE 1 TABLET BY MOUTH THREE TIMES DAILY AT LEAST 7 TO 8 HOURS BETWEEN DOSES, Disp: 90 tablet, Rfl: 4   EPINEPHrine 0.3 mg/0.3 mL IJ SOAJ injection, Inject 0.3 mg into the muscle as needed for anaphylaxis., Disp: 1 each, Rfl: 1   Fluticasone Propionate (XHANCE) 93 MCG/ACT EXHU, Place 1 spray into the nose in the morning and at bedtime., Disp: 16 mL, Rfl: 6   hydrOXYzine (ATARAX) 10 MG tablet, TAKE 1 TABLET(10 MG) BY MOUTH THREE TIMES DAILY AS NEEDED, Disp: 90 tablet, Rfl: 1   omeprazole (PRILOSEC) 40 MG capsule, Take 40 mg by mouth 2 (two) times daily., Disp: , Rfl:    sertraline (ZOLOFT) 100 MG tablet, Take 1 tablet (100 mg total) by mouth daily., Disp: 60 tablet, Rfl: 5   traZODone (DESYREL) 100 MG tablet, Take one  tablet by mouth at bedtime daily, Disp: 90 tablet, Rfl: 1 Medication Side Effects: none  Family Medical/ Social History: Changes? No  MENTAL HEALTH EXAM:  There were no vitals taken for this visit.There is no height or weight on file to calculate BMI.  General Appearance: Casual, Neat, and Well Groomed  Eye Contact:  Good  Speech:  Clear and Coherent  Volume:  Normal  Mood:  NA  Affect:  Appropriate  Thought Process:  Coherent  Orientation:  Full (Time, Place, and Person)  Thought Content: Logical   Suicidal Thoughts:  No  Homicidal Thoughts:  No  Memory:  WNL  Judgement:  Good  Insight:  Good  Psychomotor Activity:  Normal  Concentration:  Concentration: Good  Recall:  Good  Fund of Knowledge: Good  Language: Good  Assets:  Desire for Improvement  ADL's:  Intact  Cognition: WNL  Prognosis:  Good    DIAGNOSES:    ICD-10-CM   1. Generalized anxiety disorder  F41.1 hydrOXYzine (ATARAX) 10 MG tablet    sertraline (ZOLOFT) 100 MG tablet    traZODone (DESYREL) 100 MG tablet    2. Mild episode of recurrent major depressive disorder (HCC)  F33.0 sertraline (ZOLOFT) 100 MG tablet    3. Recurrent major depressive disorder, in partial remission (HCC)  F33.41 sertraline (ZOLOFT) 100 MG tablet      Receiving Psychotherapy: No    RECOMMENDATIONS:   Continue Zoloft  250 mg daily  DC  Buspar  15 mg three times daily for anxiety Continue Trazodone 100 mg daily at bedtime for sleep Continue hydroxyzine 10 mg three times daily as needed for increased anxiety.  Will report and new side effects or worsening symptoms Provided after hours emergency contact information To follow up in 6 months to reassess. Refills sent  Greater than 50% of 30 min.face to face time with patient was spent on counseling and coordination of care. No changes this visit.  He is very happy with his medication and current level of stability. Feels like it is help with anxiety especially in the morning.    He is still participating in AA regularly and currently continues in remission.  Discussed good sleep hygiene and smart nutrition.     Joan Flores, NP

## 2022-10-21 DIAGNOSIS — S61412A Laceration without foreign body of left hand, initial encounter: Secondary | ICD-10-CM | POA: Diagnosis not present

## 2022-10-23 ENCOUNTER — Ambulatory Visit (INDEPENDENT_AMBULATORY_CARE_PROVIDER_SITE_OTHER): Payer: BC Managed Care – PPO

## 2022-10-23 DIAGNOSIS — J309 Allergic rhinitis, unspecified: Secondary | ICD-10-CM

## 2022-10-25 DIAGNOSIS — R946 Abnormal results of thyroid function studies: Secondary | ICD-10-CM | POA: Diagnosis not present

## 2022-10-25 DIAGNOSIS — R12 Heartburn: Secondary | ICD-10-CM | POA: Diagnosis not present

## 2022-10-25 DIAGNOSIS — J309 Allergic rhinitis, unspecified: Secondary | ICD-10-CM | POA: Diagnosis not present

## 2022-10-25 DIAGNOSIS — Z131 Encounter for screening for diabetes mellitus: Secondary | ICD-10-CM | POA: Diagnosis not present

## 2022-10-25 DIAGNOSIS — F418 Other specified anxiety disorders: Secondary | ICD-10-CM | POA: Diagnosis not present

## 2022-10-25 DIAGNOSIS — Z13220 Encounter for screening for lipoid disorders: Secondary | ICD-10-CM | POA: Diagnosis not present

## 2022-10-30 ENCOUNTER — Ambulatory Visit (INDEPENDENT_AMBULATORY_CARE_PROVIDER_SITE_OTHER): Payer: BC Managed Care – PPO

## 2022-10-30 DIAGNOSIS — J309 Allergic rhinitis, unspecified: Secondary | ICD-10-CM | POA: Diagnosis not present

## 2022-11-06 ENCOUNTER — Ambulatory Visit (INDEPENDENT_AMBULATORY_CARE_PROVIDER_SITE_OTHER): Payer: BC Managed Care – PPO

## 2022-11-06 DIAGNOSIS — J309 Allergic rhinitis, unspecified: Secondary | ICD-10-CM

## 2022-11-06 DIAGNOSIS — H01001 Unspecified blepharitis right upper eyelid: Secondary | ICD-10-CM | POA: Diagnosis not present

## 2022-11-12 ENCOUNTER — Other Ambulatory Visit: Payer: Self-pay | Admitting: Behavioral Health

## 2022-11-12 DIAGNOSIS — F411 Generalized anxiety disorder: Secondary | ICD-10-CM

## 2022-11-23 ENCOUNTER — Ambulatory Visit (INDEPENDENT_AMBULATORY_CARE_PROVIDER_SITE_OTHER): Payer: BC Managed Care – PPO | Admitting: *Deleted

## 2022-11-23 DIAGNOSIS — J309 Allergic rhinitis, unspecified: Secondary | ICD-10-CM

## 2022-12-11 ENCOUNTER — Ambulatory Visit (INDEPENDENT_AMBULATORY_CARE_PROVIDER_SITE_OTHER): Payer: Self-pay

## 2022-12-11 DIAGNOSIS — J309 Allergic rhinitis, unspecified: Secondary | ICD-10-CM | POA: Diagnosis not present

## 2022-12-22 ENCOUNTER — Ambulatory Visit (INDEPENDENT_AMBULATORY_CARE_PROVIDER_SITE_OTHER): Payer: BC Managed Care – PPO

## 2022-12-22 DIAGNOSIS — J309 Allergic rhinitis, unspecified: Secondary | ICD-10-CM | POA: Diagnosis not present

## 2022-12-22 MED ORDER — EPINEPHRINE 0.3 MG/0.3ML IJ SOAJ: 0.3000 mg | INTRAMUSCULAR | 1 refills | Status: AC | PRN

## 2022-12-29 ENCOUNTER — Other Ambulatory Visit: Payer: Self-pay | Admitting: Behavioral Health

## 2022-12-29 DIAGNOSIS — F411 Generalized anxiety disorder: Secondary | ICD-10-CM

## 2023-01-04 ENCOUNTER — Ambulatory Visit (INDEPENDENT_AMBULATORY_CARE_PROVIDER_SITE_OTHER): Payer: BC Managed Care – PPO | Admitting: *Deleted

## 2023-01-04 DIAGNOSIS — J309 Allergic rhinitis, unspecified: Secondary | ICD-10-CM

## 2023-01-12 ENCOUNTER — Ambulatory Visit (INDEPENDENT_AMBULATORY_CARE_PROVIDER_SITE_OTHER): Payer: Self-pay

## 2023-01-12 DIAGNOSIS — J309 Allergic rhinitis, unspecified: Secondary | ICD-10-CM

## 2023-02-12 ENCOUNTER — Ambulatory Visit (INDEPENDENT_AMBULATORY_CARE_PROVIDER_SITE_OTHER): Payer: BC Managed Care – PPO

## 2023-02-12 DIAGNOSIS — J309 Allergic rhinitis, unspecified: Secondary | ICD-10-CM | POA: Diagnosis not present

## 2023-04-02 ENCOUNTER — Ambulatory Visit (INDEPENDENT_AMBULATORY_CARE_PROVIDER_SITE_OTHER): Admitting: *Deleted

## 2023-04-02 DIAGNOSIS — J309 Allergic rhinitis, unspecified: Secondary | ICD-10-CM

## 2023-04-09 ENCOUNTER — Ambulatory Visit: Payer: BC Managed Care – PPO | Admitting: Behavioral Health

## 2023-04-12 ENCOUNTER — Ambulatory Visit (INDEPENDENT_AMBULATORY_CARE_PROVIDER_SITE_OTHER)

## 2023-04-12 DIAGNOSIS — J309 Allergic rhinitis, unspecified: Secondary | ICD-10-CM

## 2023-04-27 ENCOUNTER — Encounter: Payer: Self-pay | Admitting: Behavioral Health

## 2023-04-27 ENCOUNTER — Ambulatory Visit: Admitting: Behavioral Health

## 2023-04-27 DIAGNOSIS — F330 Major depressive disorder, recurrent, mild: Secondary | ICD-10-CM | POA: Diagnosis not present

## 2023-04-27 DIAGNOSIS — F411 Generalized anxiety disorder: Secondary | ICD-10-CM | POA: Diagnosis not present

## 2023-04-27 DIAGNOSIS — F3341 Major depressive disorder, recurrent, in partial remission: Secondary | ICD-10-CM | POA: Diagnosis not present

## 2023-04-27 MED ORDER — SERTRALINE HCL 100 MG PO TABS
ORAL_TABLET | ORAL | 5 refills | Status: DC
Start: 2023-04-27 — End: 2023-10-29

## 2023-04-27 MED ORDER — TRAZODONE HCL 100 MG PO TABS
ORAL_TABLET | ORAL | 1 refills | Status: AC
Start: 2023-04-27 — End: ?

## 2023-04-27 NOTE — Progress Notes (Signed)
 [facility]  Patient ID: Joseph Eaton,  MRN: [id number]  PCP: Farris Has, MD  Date of Evaluation: 04/27/2023 Time spent:30 minutes  Chief Complaint:  Chief Complaint   Depression; Anxiety; Family Problem; Medication Refill; Follow-up; Patient Education     HISTORY/CURRENT STATUS: HPI  43 year old patient presents to this office for follow up and medication management. Says that he is still doing pretty well overall. He and his wife are legally separated and settled financially since last visit. He has sought therapy.  Not really needing to utilze hydroxyzine right now.  Trying to stay active through all of the changes in family dynamics. Not wanting to change his medications. Says he still is active in AA weekly and Bible study.  Reports anxiety 2/10, depression 1/10. Sleeping 7-8 hours per evening. No mania, psychosis. No SI/HI.     Prior psychiatric medication trials: Abilify Inderal Prozac      Individual Medical History/ Review of Systems: Changes? :No   Allergies: Patient has no known allergies.  Current Medications:  Current Outpatient Medications:    EPINEPHrine 0.3 mg/0.3 mL IJ SOAJ injection, Inject 0.3 mg into the muscle as needed for anaphylaxis., Disp: 1 each, Rfl: 1   Fluticasone Propionate (XHANCE) 93 MCG/ACT EXHU, Place 1 spray into the nose in the morning and at bedtime., Disp: 16 mL, Rfl: 6   hydrOXYzine (ATARAX) 10 MG tablet, TAKE 1 TABLET(10 MG) BY MOUTH THREE TIMES DAILY AS NEEDED, Disp: 90 tablet, Rfl: 1   omeprazole (PRILOSEC) 40 MG capsule, Take 40 mg by mouth 2 (two) times daily., Disp: , Rfl:    sertraline (ZOLOFT) 100 MG tablet, Take two tablets by mouth daily., Disp: 60 tablet, Rfl: 5   traZODone (DESYREL) 100 MG tablet, TAKE 1 TABLET BY MOUTH DAILY AT BEDTIME, Disp: 90 tablet, Rfl: 1 Medication Side Effects: none  Family Medical/ Social History: Changes? No  MENTAL HEALTH EXAM:  There were no vitals taken for this  visit.There is no height or weight on file to calculate BMI.  General Appearance: Casual, Neat, and Well Groomed  Eye Contact:  NA  Speech:  Clear and Coherent  Volume:  Normal  Mood:  NA  Affect:  Appropriate  Thought Process:  Coherent  Orientation:  Full (Time, Place, and Person)  Thought Content: Logical   Suicidal Thoughts:  No  Homicidal Thoughts:  No  Memory:  WNL  Judgement:  Good  Insight:  Good  Psychomotor Activity:  Normal  Concentration:  Concentration: Good  Recall:  Good  Fund of Knowledge: Good  Language: Good  Assets:  Desire for Improvement  ADL's:  Intact  Cognition: WNL  Prognosis:  Good    DIAGNOSES:    ICD-10-CM   1. Generalized anxiety disorder  F41.1 sertraline (ZOLOFT) 100 MG tablet    traZODone (DESYREL) 100 MG tablet    2. Mild episode of recurrent major depressive disorder (HCC)  F33.0 sertraline (ZOLOFT) 100 MG tablet    3. Recurrent major depressive disorder, in partial remission (HCC)  F33.41 sertraline (ZOLOFT) 100 MG tablet      Receiving Psychotherapy: yes   RECOMMENDATIONS:   Continue Zoloft 200 mg daily  Continue Trazodone 100 mg daily at bedtime for sleep Continue hydroxyzine 10 mg three times daily as needed for increased anxiety.  Will report and new side effects or worsening symptoms Provided after hours emergency contact information To follow up in 6 months to reassess. Refills sent   Greater than 50% of 30 min.face  to face time with patient was spent on counseling and coordination of care. We discussed his pending divorce from wife. For now says he is adjusting well.  He is requesting no change or adjustment to his medication.  He is still participating in AA regularly and currently continues in remission.  Discussed good sleep hygiene and smart nutrition.   Joan Flores, NP

## 2023-05-16 ENCOUNTER — Ambulatory Visit (INDEPENDENT_AMBULATORY_CARE_PROVIDER_SITE_OTHER): Payer: Self-pay

## 2023-05-16 DIAGNOSIS — J309 Allergic rhinitis, unspecified: Secondary | ICD-10-CM | POA: Diagnosis not present

## 2023-06-13 DIAGNOSIS — J3089 Other allergic rhinitis: Secondary | ICD-10-CM | POA: Diagnosis not present

## 2023-06-13 NOTE — Progress Notes (Signed)
 VIALS MADE 06-13-23

## 2023-06-14 ENCOUNTER — Ambulatory Visit (INDEPENDENT_AMBULATORY_CARE_PROVIDER_SITE_OTHER)

## 2023-06-14 DIAGNOSIS — J309 Allergic rhinitis, unspecified: Secondary | ICD-10-CM

## 2023-06-15 DIAGNOSIS — J3081 Allergic rhinitis due to animal (cat) (dog) hair and dander: Secondary | ICD-10-CM | POA: Diagnosis not present

## 2023-07-20 ENCOUNTER — Ambulatory Visit (INDEPENDENT_AMBULATORY_CARE_PROVIDER_SITE_OTHER): Payer: Self-pay

## 2023-07-20 DIAGNOSIS — J309 Allergic rhinitis, unspecified: Secondary | ICD-10-CM

## 2023-08-17 ENCOUNTER — Ambulatory Visit

## 2023-08-17 DIAGNOSIS — J309 Allergic rhinitis, unspecified: Secondary | ICD-10-CM | POA: Diagnosis not present

## 2023-08-19 NOTE — Progress Notes (Unsigned)
   [address] [place] [address] Dept: [phone]  FOLLOW UP NOTE  Patient ID: Joseph Eaton, male    DOB: Jan 11, 1981  Age: 43 y.o. MRN: [id number] Date of Office Visit: 08/20/2023  Assessment  Chief Complaint: No chief complaint on file.  HPI Joseph Eaton is a 43 year old male who presents to the clinic for a follow up visit. He was originally seen in this clinic on 11/18/2021 by Dr. Lorin for evaluation of allergic rhinitis. He completed RUSH immunotherapy directed toward grass pollen, weed pollen, tree pollen, mold, dust mite, and cat on 12/13/2021.  Discussed the use of AI scribe software for clinical note transcription with the patient, who gave verbal consent to proceed.  History of Present Illness      Drug Allergies:  No Known Allergies  Physical Exam: There were no vitals taken for this visit.   Physical Exam  Diagnostics:    Assessment and Plan: No diagnosis found.  No orders of the defined types were placed in this encounter.   There are no Patient Instructions on file for this visit.  No follow-ups on file.    Thank you for the opportunity to care for this patient.  Please do not hesitate to contact me with questions.  Arlean Mutter, FNP [facility]

## 2023-08-19 NOTE — Patient Instructions (Signed)
 Allergic rhinitis Continue allergen avoidance measures directed toward grass pollen, weed pollen, tree pollen, mold, dust mites, and cat as listed below Continue allergen immunotherapy and have access to an epipen  set at all times.  Continue cetirizine 10 mg once a day if needed for a runny nose or itch. Remember to rotate to a different antihistamine about every 3 months. Some examples of over the counter antihistamines include Zyrtec (cetirizine), Xyzal (levocetirizine), Allegra (fexofenadine), and Claritin (loratidine).   Continue Flonase 2 sprays in each nostril once a day if needed for a stuffy nose Consider saline nasal rinses as needed for nasal symptoms. Use this before any medicated nasal sprays for best result  Nasal polyposis Continue Flonase 2 sprays in each nostril once a day to control nasal polyposis  Call the clinic if this treatment plan is not working well for you  Follow up in 1 year or sooner if needed.  Reducing Pollen Exposure The American Academy of [employer] , [employer] suggests the following steps to reduce your exposure to pollen during allergy  seasons. Do not hang sheets or clothing out to dry; pollen may collect on these items. Do not mow lawns or spend time around freshly cut grass; mowing stirs up pollen. Keep windows closed at night.  Keep car windows closed while driving. Minimize morning activities outdoors, a time when pollen counts are usually at their highest. Stay indoors as much as possible when pollen counts or humidity is high and on windy days when pollen tends to remain in the air longer. Use air conditioning when possible.  Many air conditioners have filters that trap the pollen spores. Use a HEPA room air filter to remove pollen form the indoor air you breathe.  Control of Mold Allergen Mold and fungi can grow on a variety of surfaces provided certain temperature and moisture conditions exist.  Outdoor molds grow on plants, decaying  vegetation and soil.  The major outdoor mold, Alternaria and Cladosporium, are found in very high numbers during hot and dry conditions.  Generally, a late Summer - Fall peak is seen for common outdoor fungal spores.  Rain will temporarily lower outdoor mold spore count, but counts rise rapidly when the rainy period ends.  The most important indoor molds are Aspergillus and Penicillium.  Dark, humid and poorly ventilated basements are ideal sites for mold growth.  The next most common sites of mold growth are the bathroom and the kitchen.  Outdoor [employer] Use air conditioning and keep windows closed Avoid exposure to decaying vegetation. Avoid leaf raking. Avoid grain handling. Consider wearing a face mask if working in moldy areas.  Indoor Mold Control Maintain humidity below 50%. Clean washable surfaces with 5% bleach solution. Remove sources e.g. Contaminated carpets.  Control of Dog or Cat Allergen Avoidance is the best way to manage a dog or cat allergy . If you have a dog or cat and are allergic to dog or cats, consider removing the dog or cat from the home. If you have a dog or cat but don't want to find it a new home, or if your family wants a pet even though someone in the household is allergic, here are some strategies that may help keep symptoms at bay:  Keep the pet out of your bedroom and restrict it to only a few rooms. Be advised that keeping the dog or cat in only one room will not limit the allergens to that room. Don't pet, hug or kiss the dog or cat; if  you do, wash your hands with soap and water. High-efficiency particulate air (HEPA) cleaners run continuously in a bedroom or living room can reduce allergen levels over time. Regular use of a high-efficiency vacuum cleaner or a central vacuum can reduce allergen levels. Giving your dog or cat a bath at least once a week can reduce airborne allergen.   Control of Dust Mite Allergen Dust mites play a major role in  allergic asthma and rhinitis. They occur in environments with high humidity wherever human skin is found. Dust mites absorb humidity from the atmosphere (ie, they do not drink) and feed on organic matter (including shed human and animal skin). Dust mites are a microscopic type of insect that you cannot see with the naked eye. High levels of dust mites have been detected from mattresses, pillows, carpets, upholstered furniture, bed covers, clothes, soft toys and any woven material. The principal allergen of the dust mite is found in its feces. A gram of dust may contain 1,000 mites and 250,000 fecal particles. Mite antigen is easily measured in the air during house cleaning activities. Dust mites do not bite and do not cause harm to humans, other than by triggering allergies/asthma.  Ways to decrease your exposure to dust mites in your home:  1. Encase mattresses, box springs and pillows with a mite-impermeable barrier or cover  2. Wash sheets, blankets and drapes weekly in hot water (130 F) with detergent and dry them in a dryer on the hot setting.  3. Have the room cleaned frequently with a vacuum cleaner and a damp dust-mop. For carpeting or rugs, vacuuming with a vacuum cleaner equipped with a high-efficiency particulate air (HEPA) filter. The dust mite allergic individual should not be in a room which is being cleaned and should wait 1 hour after cleaning before going into the room.  4. Do not sleep on upholstered furniture (eg, couches).  5. If possible removing carpeting, upholstered furniture and drapery from the home is ideal. Horizontal blinds should be eliminated in the rooms where the person spends the most time (bedroom, study, television room). Washable vinyl, roller-type shades are optimal.  6. Remove all non-washable stuffed toys from the bedroom. Wash stuffed toys weekly like sheets and blankets above.  7. Reduce indoor humidity to less than 50%. Inexpensive humidity monitors can be  purchased at most hardware stores. Do not use a humidifier as can make the problem worse and are not recommended.

## 2023-08-20 ENCOUNTER — Encounter: Payer: Self-pay | Admitting: Family Medicine

## 2023-08-20 ENCOUNTER — Other Ambulatory Visit: Payer: Self-pay

## 2023-08-20 ENCOUNTER — Ambulatory Visit (INDEPENDENT_AMBULATORY_CARE_PROVIDER_SITE_OTHER): Admitting: Family Medicine

## 2023-08-20 VITALS — BP 110/60 | HR 83 | Temp 98.1°F | Ht 71.26 in | Wt 156.0 lb

## 2023-08-20 DIAGNOSIS — J302 Other seasonal allergic rhinitis: Secondary | ICD-10-CM | POA: Diagnosis not present

## 2023-08-20 DIAGNOSIS — J330 Polyp of nasal cavity: Secondary | ICD-10-CM | POA: Diagnosis not present

## 2023-08-20 DIAGNOSIS — J3089 Other allergic rhinitis: Secondary | ICD-10-CM

## 2023-08-31 ENCOUNTER — Ambulatory Visit

## 2023-08-31 DIAGNOSIS — J309 Allergic rhinitis, unspecified: Secondary | ICD-10-CM | POA: Diagnosis not present

## 2023-09-13 ENCOUNTER — Ambulatory Visit (INDEPENDENT_AMBULATORY_CARE_PROVIDER_SITE_OTHER)

## 2023-09-13 DIAGNOSIS — J309 Allergic rhinitis, unspecified: Secondary | ICD-10-CM | POA: Diagnosis not present

## 2023-09-28 ENCOUNTER — Ambulatory Visit

## 2023-09-28 DIAGNOSIS — J309 Allergic rhinitis, unspecified: Secondary | ICD-10-CM

## 2023-10-03 ENCOUNTER — Ambulatory Visit (INDEPENDENT_AMBULATORY_CARE_PROVIDER_SITE_OTHER)

## 2023-10-03 DIAGNOSIS — J309 Allergic rhinitis, unspecified: Secondary | ICD-10-CM | POA: Diagnosis not present

## 2023-10-29 ENCOUNTER — Encounter: Payer: Self-pay | Admitting: Behavioral Health

## 2023-10-29 ENCOUNTER — Ambulatory Visit (INDEPENDENT_AMBULATORY_CARE_PROVIDER_SITE_OTHER): Admitting: Behavioral Health

## 2023-10-29 DIAGNOSIS — F3341 Major depressive disorder, recurrent, in partial remission: Secondary | ICD-10-CM | POA: Diagnosis not present

## 2023-10-29 DIAGNOSIS — F330 Major depressive disorder, recurrent, mild: Secondary | ICD-10-CM | POA: Diagnosis not present

## 2023-10-29 DIAGNOSIS — F411 Generalized anxiety disorder: Secondary | ICD-10-CM | POA: Diagnosis not present

## 2023-10-29 MED ORDER — SERTRALINE HCL 100 MG PO TABS: ORAL_TABLET | ORAL | 5 refills | Status: DC

## 2023-10-29 MED ORDER — HYDROXYZINE HCL 10 MG PO TABS: ORAL_TABLET | ORAL | 1 refills | Status: DC

## 2023-10-29 MED ORDER — MIRTAZAPINE 15 MG PO TABS: 15.0000 mg | ORAL_TABLET | Freq: Every day | ORAL | 1 refills | Status: DC

## 2023-10-29 NOTE — Progress Notes (Signed)
 [facility]  Patient ID: Joseph Eaton,  MRN: [id number]  PCP: Kip Righter, MD  Date of Evaluation: 10/29/2023 Time spent:30 minutes  Chief Complaint:  Chief Complaint   Anxiety; Family Problem; Depression; Follow-up; Medication Refill; Stress; Patient Education     HISTORY/CURRENT STATUS: HPI 43 year old patient presents to this office for follow up and medication management. Says that he is still doing pretty well overall. Looks thinner than he should be. He and his wife are legally separated and settled financially since last visit. He has sought therapy. Reports has been struggling some.  Appetite has been poor and he has been losing weight. Says he is dating again but anxiety is high some days.  Says he still is active in AA weekly and Bible study.  Open to taking medication that can help with appetite. Reports anxiety 2/10, depression 1/10. Sleeping 7-8 hours per evening. No mania, psychosis. No SI/HI.   Prior psychiatric medication trials: Abilify  Inderal Prozac  Individual Medical History/ Review of Systems: Changes? :No   Allergies: Patient has no known allergies.  Current Medications:  Current Outpatient Medications:    mirtazapine (REMERON) 15 MG tablet, Take 1 tablet (15 mg total) by mouth at bedtime., Disp: 30 tablet, Rfl: 1   EPINEPHrine  0.3 mg/0.3 mL IJ SOAJ injection, Inject 0.3 mg into the muscle as needed for anaphylaxis., Disp: 1 each, Rfl: 1   Fluticasone Propionate  (XHANCE ) 93 MCG/ACT EXHU, Place 1 spray into the nose in the morning and at bedtime. (Patient not taking: Reported on 08/20/2023), Disp: 16 mL, Rfl: 6   hydrOXYzine  (ATARAX ) 10 MG tablet, TAKE 1 TABLET(10 MG) BY MOUTH THREE TIMES DAILY AS NEEDED, Disp: 90 tablet, Rfl: 1   omeprazole (PRILOSEC) 40 MG capsule, Take 40 mg by mouth 2 (two) times daily., Disp: , Rfl:    sertraline  (ZOLOFT ) 100 MG tablet, Take two tablets by mouth daily., Disp: 60 tablet, Rfl: 5   traZODone  (DESYREL ) 100  MG tablet, TAKE 1 TABLET BY MOUTH DAILY AT BEDTIME, Disp: 90 tablet, Rfl: 1 Medication Side Effects: none  Family Medical/ Social History: Changes? No  MENTAL HEALTH EXAM:  There were no vitals taken for this visit.There is no height or weight on file to calculate BMI.  General Appearance: Casual, Neat, and Well Groomed  Eye Contact:  Good  Speech:  Clear and Coherent  Volume:  Normal  Mood:  Anxious and Depressed  Affect:  Depressed and Anxious  Thought Process:  Coherent  Orientation:  Full (Time, Place, and Person)  Thought Content: Logical   Suicidal Thoughts:  No  Homicidal Thoughts:  No  Memory:  WNL  Judgement:  Good  Insight:  Good  Psychomotor Activity:  Normal  Concentration:  Concentration: Good  Recall:  Good  Fund of Knowledge: Good  Language: Good  Assets:  Desire for Improvement  ADL's:  Intact  Cognition: WNL  Prognosis:  Good    DIAGNOSES:    ICD-10-CM   1. 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 Recurrent major depressive disorder, in partial remission  F33.41 sertraline  (ZOLOFT ) 100 MG tablet    mirtazapine (REMERON) 15 MG tablet      Receiving Psychotherapy: yes   RECOMMENDATIONS:  Continue Zoloft  200 mg daily  Stopped Trazodone  Start Mirtazapine 15 mg daily at bedtime Continue hydroxyzine  10 mg three times daily as needed for increased anxiety.  Will report and new side effects or worsening symptoms Provided after hours emergency contact information To follow up in 3 months to reassess. Refills sent   Greater than 50% of 30 min.face to face time with patient was spent on counseling and coordination of care. We discussed his pending divorce from wife   Starting to date again, But struggling  some day. Appetite has been poor. He is requesting no change or adjustment to his medication.  He is still participating in AA regularly and currently continues in remission.  Discussed good sleep hygiene and smart nutrition.    Redell DELENA Pizza, NP

## 2023-10-30 ENCOUNTER — Ambulatory Visit (INDEPENDENT_AMBULATORY_CARE_PROVIDER_SITE_OTHER)

## 2023-10-30 DIAGNOSIS — J309 Allergic rhinitis, unspecified: Secondary | ICD-10-CM | POA: Diagnosis not present

## 2023-11-08 ENCOUNTER — Other Ambulatory Visit: Payer: Self-pay | Admitting: Behavioral Health

## 2023-11-08 DIAGNOSIS — F411 Generalized anxiety disorder: Secondary | ICD-10-CM

## 2023-12-03 ENCOUNTER — Ambulatory Visit (INDEPENDENT_AMBULATORY_CARE_PROVIDER_SITE_OTHER)

## 2023-12-03 DIAGNOSIS — J309 Allergic rhinitis, unspecified: Secondary | ICD-10-CM | POA: Diagnosis not present

## 2023-12-31 ENCOUNTER — Ambulatory Visit

## 2023-12-31 DIAGNOSIS — J309 Allergic rhinitis, unspecified: Secondary | ICD-10-CM

## 2024-01-21 DIAGNOSIS — M25571 Pain in right ankle and joints of right foot: Secondary | ICD-10-CM | POA: Diagnosis not present

## 2024-02-04 ENCOUNTER — Ambulatory Visit: Admitting: Behavioral Health

## 2024-02-04 ENCOUNTER — Encounter: Payer: Self-pay | Admitting: Behavioral Health

## 2024-02-04 DIAGNOSIS — F411 Generalized anxiety disorder: Secondary | ICD-10-CM

## 2024-02-04 DIAGNOSIS — F3341 Major depressive disorder, recurrent, in partial remission: Secondary | ICD-10-CM | POA: Diagnosis not present

## 2024-02-04 DIAGNOSIS — F330 Major depressive disorder, recurrent, mild: Secondary | ICD-10-CM | POA: Diagnosis not present

## 2024-02-04 MED ORDER — MIRTAZAPINE 15 MG PO TABS: 15.0000 mg | ORAL_TABLET | Freq: Every day | ORAL | 3 refills | Status: AC

## 2024-02-04 MED ORDER — HYDROXYZINE HCL 10 MG PO TABS: ORAL_TABLET | ORAL | 3 refills | Status: AC

## 2024-02-04 MED ORDER — SERTRALINE HCL 100 MG PO TABS: ORAL_TABLET | ORAL | 5 refills | Status: AC

## 2024-02-04 NOTE — Progress Notes (Signed)
 "     [facility]  Patient ID: Burnette Sautter,  MRN: [id number]  PCP: Kip Righter, MD  Date of Evaluation: 02/04/2024 Time spent:30 minutes  Chief Complaint:  Chief Complaint   Depression; Anxiety; Follow-up; Medication Refill; Patient Education; Stress     HISTORY/CURRENT STATUS: HPI   Joseph Eaton, 44 year old patient presents to this office for follow up and medication management. Says that he is still doing pretty well overall.  Reports that he and his wife are still legally separated.  He has been seeing someone new for approximately 3 months.  Says that he is not ready to get too serious.  Still a little on the thin side.   Says he still is active in AA weekly.  Reports anxiety 2/10, depression 1/10. Sleeping 7-8 hours per evening. No mania, psychosis. No SI/HI.   Prior psychiatric medication trials: Abilify  Inderal Prozac     Individual Medical History/ Review of Systems: Changes? :No   Allergies: Patient has no known allergies.  Current Medications: Current Medications[1] Medication Side Effects: none  Family Medical/ Social History: Changes? No  MENTAL HEALTH EXAM:  There were no vitals taken for this visit.There is no height or weight on file to calculate BMI.  General Appearance: Casual  Eye Contact:  Good  Speech:  Clear and Coherent  Volume:  Normal  Mood:  NA  Affect:  Appropriate  Thought Process:  Coherent  Orientation:  Full (Time, Place, and Person)  Thought Content: Logical   Suicidal Thoughts:  No  Homicidal Thoughts:  No  Memory:  WNL  Judgement:  Good  Insight:  Good  Psychomotor Activity:  Normal  Concentration:  Concentration: Good  Recall:  Good  Fund of Knowledge: Good  Language: Good  Assets:  Desire for Improvement  ADL's:  Intact  Cognition: WNL  Prognosis:  Good    DIAGNOSES:    ICD-10-CM   1. Generalized anxiety disorder  F41.1 hydrOXYzine  (ATARAX ) 10 MG tablet    mirtazapine  (REMERON ) 15 MG tablet    sertraline   (ZOLOFT ) 100 MG tablet    2. 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 Recurrent major depressive disorder, in partial remission  F33.41 mirtazapine  (REMERON ) 15 MG tablet    sertraline  (ZOLOFT ) 100 MG tablet      Receiving Psychotherapy: No    RECOMMENDATIONS:   Continue Zoloft  200 mg daily  Stopped Trazodone  Start Mirtazapine  15 mg daily at bedtime Continue hydroxyzine  10 mg three times daily as needed for increased anxiety.  Will report and new side effects or worsening symptoms Provided after hours emergency contact information To follow up in 3 months to reassess. Refills sent   Greater than 50% of 30 min.face to face time with patient was spent on counseling and coordination of care. We discussed his pending divorce from wife   Starting to date again, But struggling some day. Appetite has been poor. He is requesting no change or adjustment to his medication.  He is still participating in AA regularly and currently continues in remission.  Discussed good sleep hygiene and smart nutrition.     Redell DELENA Pizza, NP      [1]  Current Outpatient Medications:    EPINEPHrine  0.3 mg/0.3 mL IJ SOAJ injection, Inject 0.3 mg into the muscle as needed for anaphylaxis., Disp: 1 each, Rfl: 1   Fluticasone Propionate  (XHANCE ) 93 MCG/ACT EXHU,  Place 1 spray into the nose in the morning and at bedtime. (Patient not taking: Reported on 08/20/2023), Disp: 16 mL, Rfl: 6   hydrOXYzine  (ATARAX ) 10 MG tablet, TAKE 1 TABLET(10 MG) BY MOUTH THREE TIMES DAILY AS NEEDED, Disp: 90 tablet, Rfl: 3   mirtazapine  (REMERON ) 15 MG tablet, Take 1 tablet (15 mg total) by mouth at bedtime., Disp: 30 tablet, Rfl: 3   omeprazole (PRILOSEC) 40 MG capsule, Take 40 mg by mouth 2 (two) times daily., Disp: , Rfl:    sertraline  (ZOLOFT ) 100 MG tablet, Take two tablets by mouth daily., Disp: 60 tablet, Rfl: 5   traZODone   (DESYREL ) 100 MG tablet, TAKE 1 TABLET BY MOUTH DAILY AT BEDTIME, Disp: 90 tablet, Rfl: 1  "

## 2024-02-15 ENCOUNTER — Ambulatory Visit (INDEPENDENT_AMBULATORY_CARE_PROVIDER_SITE_OTHER)

## 2024-02-15 DIAGNOSIS — J302 Other seasonal allergic rhinitis: Secondary | ICD-10-CM

## 2024-02-26 NOTE — Progress Notes (Signed)
 VIALS MADE ON 02/26/24

## 2024-08-04 ENCOUNTER — Ambulatory Visit: Admitting: Behavioral Health
